# Patient Record
Sex: Male | Born: 1960 | Race: White | Hispanic: No | Marital: Married | Smoking: Never smoker
Health system: Southern US, Community
[De-identification: ages and names within clinical notes are randomized; demographics above are authoritative.]

## PROBLEM LIST (undated history)

## (undated) DIAGNOSIS — I10 Essential (primary) hypertension: Secondary | ICD-10-CM

## (undated) DIAGNOSIS — E785 Hyperlipidemia, unspecified: Secondary | ICD-10-CM

## (undated) DIAGNOSIS — F32A Depression, unspecified: Secondary | ICD-10-CM

## (undated) DIAGNOSIS — F329 Major depressive disorder, single episode, unspecified: Secondary | ICD-10-CM

## (undated) DIAGNOSIS — F419 Anxiety disorder, unspecified: Secondary | ICD-10-CM

## (undated) DIAGNOSIS — J302 Other seasonal allergic rhinitis: Secondary | ICD-10-CM

## (undated) DIAGNOSIS — G20 Parkinson's disease: Secondary | ICD-10-CM

## (undated) HISTORY — PX: NECK SURGERY: SHX720

## (undated) HISTORY — PX: KNEE SURGERY: SHX244

## (undated) HISTORY — PX: ADENOIDECTOMY: SUR15

## (undated) HISTORY — PX: TONSILLECTOMY: SUR1361

---

## 2011-04-06 DIAGNOSIS — Z8249 Family history of ischemic heart disease and other diseases of the circulatory system: Secondary | ICD-10-CM | POA: Insufficient documentation

## 2011-04-06 DIAGNOSIS — Z720 Tobacco use: Secondary | ICD-10-CM | POA: Insufficient documentation

## 2011-06-11 DIAGNOSIS — M5412 Radiculopathy, cervical region: Secondary | ICD-10-CM | POA: Insufficient documentation

## 2011-06-11 DIAGNOSIS — F09 Unspecified mental disorder due to known physiological condition: Secondary | ICD-10-CM | POA: Insufficient documentation

## 2011-06-11 DIAGNOSIS — M5416 Radiculopathy, lumbar region: Secondary | ICD-10-CM | POA: Insufficient documentation

## 2011-10-08 ENCOUNTER — Encounter: Payer: Self-pay | Admitting: *Deleted

## 2011-10-08 ENCOUNTER — Emergency Department
Admission: EM | Admit: 2011-10-08 | Discharge: 2011-10-08 | Disposition: A | Discharge status: Home / Self Care | Payer: No Typology Code available for payment source | Source: Home / Self Care | Attending: Family Medicine | Admitting: Family Medicine

## 2011-10-08 DIAGNOSIS — J069 Acute upper respiratory infection, unspecified: Secondary | ICD-10-CM

## 2011-10-08 DIAGNOSIS — J0100 Acute maxillary sinusitis, unspecified: Secondary | ICD-10-CM

## 2011-10-08 HISTORY — DX: Essential (primary) hypertension: I10

## 2011-10-08 HISTORY — DX: Anxiety disorder, unspecified: F41.9

## 2011-10-08 HISTORY — DX: Hyperlipidemia, unspecified: E78.5

## 2011-10-08 MED ORDER — BENZONATATE 200 MG PO CAPS: 200.0000 mg | ORAL_CAPSULE | Freq: Every day | ORAL | Status: AC

## 2011-10-08 MED ORDER — MECLIZINE HCL 25 MG PO TABS: ORAL_TABLET | ORAL | Status: AC

## 2011-10-08 MED ORDER — AMOXICILLIN 875 MG PO TABS: 875.0000 mg | ORAL_TABLET | Freq: Two times a day (BID) | ORAL | Status: AC

## 2011-10-08 NOTE — ED Notes (Signed)
Pt c/o nasal congestion, bilateral ear ache, vertigo, and sinus pressure x 4 days. Denies fever. He has taken coricidin HBP with no relief.

## 2011-10-08 NOTE — ED Provider Notes (Signed)
History     CSN: [id number]  Arrival date & time 10/08/11  1035   First MD Initiated Contact with Patient 10/08/11 1108      Chief Complaint  Patient presents with  . Nasal Congestion  . Otalgia      HPI Comments: Patient complains of approximately 5 day history of gradually progressive URI symptoms beginning with a mild sore throat (now improved), followed by progressive nasal congestion.  A cough started about 2 days ago.  Complains of fatigue but no myalgias.  Cough is now worse at night and partly productive during the day.  There has been no pleuritic pain, shortness of breath, or wheezes.  He has now developed bilateral facial pressure, and today he had mild bilateral earache.  He has also intermittently felt dizzy with movement today.  The history is provided by the patient.    Past Medical History  Diagnosis Date  . Hypertension   . Anxiety   . Hyperlipemia     Past Surgical History  Procedure Date  . Knee surgery   . Tonsillectomy   . Adenoidectomy     Family History  Problem Relation Age of Onset  . Hypertension Mother   . Anxiety disorder Mother   . Hypertension Father   . Anxiety disorder Father   . Hypertension Brother   . Anxiety disorder Brother     History  Substance Use Topics  . Smoking status: Never Smoker   . Smokeless tobacco: Not on file  . Alcohol Use: No      Review of Systems + sore throat + cough + dizziness No pleuritic pain No wheezing + nasal congestion + post-nasal drainage + sinus pain/pressure No itchy/red eyes ? earache No hemoptysis No SOB No fever/chills No nausea No vomiting No abdominal pain No diarrhea No urinary symptoms No skin rashes + fatigue No myalgias + headache Used OTC meds without relief  Allergies  Codeine  Home Medications   Current Outpatient Rx  Name Route Sig Dispense Refill  . ALPRAZOLAM 0.25 MG PO TABS Oral Take 0.25 mg by mouth at bedtime as needed.    Marland Kitchen AMLODIPINE BESYLATE 5  MG PO TABS Oral Take 5 mg by mouth daily.    Marland Kitchen ESCITALOPRAM OXALATE 10 MG PO TABS Oral Take 10 mg by mouth daily.    Marland Kitchen OLMESARTAN MEDOXOMIL 20 MG PO TABS Oral Take 20 mg by mouth daily.    Marland Kitchen SIMVASTATIN 10 MG PO TABS Oral Take 10 mg by mouth at bedtime.    . AMOXICILLIN 875 MG PO TABS Oral Take 1 tablet (875 mg total) by mouth 2 (two) times daily. 20 tablet 0  . BENZONATATE 200 MG PO CAPS Oral Take 1 capsule (200 mg total) by mouth at bedtime. Take as needed for cough 12 capsule 0  . MECLIZINE HCL 25 MG PO TABS  Take one tab by mouth 2 or 3 times daily as needed for dizziness 15 tablet 0    BP 136/87  Pulse 54  Temp 98 F (36.7 C) (Oral)  Resp 18  Ht 5\' 9"  (1.753 m)  Wt 193 lb 8 oz (87.771 kg)  BMI 28.57 kg/m2  SpO2 98%  Physical Exam Nursing notes and Vital Signs reviewed. Appearance:  Patient appears healthy, stated age, and in no acute distress Eyes:  Pupils are equal, round, and reactive to light and accomodation.  Extraocular movement is intact.  Conjunctivae are not inflamed.  No nystagmus  Ears:  Canals normal.  Tympanic membranes normal.  Nose:  Mildly congested turbinates.  Maxillary sinus tenderness is present.  Pharynx:  Normal Neck:  Supple.  Slightly tender shotty anterior/posterior nodes are palpated bilaterally  Lungs:  Clear to auscultation.  Breath sounds are equal.  Heart:  Regular rate and rhythm without murmurs, rubs, or gallops.  Abdomen:  Nontender without masses or hepatosplenomegaly.  Bowel sounds are present.  No CVA or flank tenderness.  Extremities:  No edema.  No calf tenderness Skin:  No rash present.   ED Course  Procedures  none      1. Acute upper respiratory infections of unspecified site   2. Acute maxillary sinusitis       MDM  Begin amoxicillin.  Prescription written for Benzonatate [facility]) to take at bedtime for night-time cough.  Take plain Mucinex (guaifenesin) twice daily for cough and congestion.  Increase fluid intake,  rest. May use Afrin nasal spray (or generic oxymetazoline) twice daily for about 5 days.  Also recommend using saline nasal spray several times daily and saline nasal irrigation (AYR is a common brand) Stop all antihistamines for now, and other non-prescription cough/cold preparations. Since his dizziness in intermittent, will write Rx for Meclizine as an option.  May begin Meclizine if dizziness becomes worse. Follow-up with family doctor if not improving 7 to 10 days.         Lattie Haw, MD 10/08/11 1128

## 2011-10-10 ENCOUNTER — Telehealth: Payer: Self-pay

## 2011-10-10 NOTE — ED Notes (Signed)
Left a message on voice mail asking how patient is feeling and advising to call back with any questions or concerns.  

## 2011-10-28 ENCOUNTER — Emergency Department
Admission: EM | Admit: 2011-10-28 | Discharge: 2011-10-28 | Disposition: A | Discharge status: Home / Self Care | Payer: No Typology Code available for payment source | Source: Home / Self Care

## 2011-10-28 ENCOUNTER — Encounter: Payer: Self-pay | Admitting: *Deleted

## 2011-10-28 DIAGNOSIS — J069 Acute upper respiratory infection, unspecified: Secondary | ICD-10-CM

## 2011-10-28 DIAGNOSIS — J309 Allergic rhinitis, unspecified: Secondary | ICD-10-CM

## 2011-10-28 MED ORDER — CETIRIZINE HCL 10 MG PO TABS: 10.0000 mg | ORAL_TABLET | Freq: Every day | ORAL | Status: DC

## 2011-10-28 MED ORDER — AZITHROMYCIN 250 MG PO TABS: ORAL_TABLET | ORAL | Status: DC

## 2011-10-28 MED ORDER — FLUTICASONE PROPIONATE 50 MCG/ACT NA SUSP: NASAL | Status: DC

## 2011-10-28 NOTE — ED Provider Notes (Signed)
History     CSN: [id number]  Arrival date & time 10/28/11  4098   First MD Initiated Contact with Patient 10/28/11 1013      Chief Complaint  Patient presents with  . Nasal Congestion  . Fatigue   HPI URI Symptoms Onset: 4-5 days  Description: rhinorrhea, nasal congestion, post nasal drip, general fatigue Modifying factors:  Pt recently seen here for similar sxs 3 weeks ago. Was placed on course of amox. Pt states that sxs improved while on medication and resolved until last weekend when sxs returned. Sxs were milder. No joint pains, headache.   Symptoms Nasal discharge: yes Fever: no Sore throat: no Cough: no Wheezing: no Ear pain: no GI symptoms: no Sick contacts: no  Red Flags  Stiff neck: no Dyspnea: no Rash: no Swallowing difficulty: no  Sinusitis Risk Factors Headache/face pain: no Double sickening: no tooth pain: no  Allergy Risk Factors Sneezing: yes Itchy scratchy throat: yes Seasonal symptoms: yes  Flu Risk Factors Headache: no muscle aches: no severe fatigue: no   Past Medical History  Diagnosis Date  . Hypertension   . Anxiety   . Hyperlipemia     Past Surgical History  Procedure Date  . Knee surgery   . Tonsillectomy   . Adenoidectomy     Family History  Problem Relation Age of Onset  . Hypertension Mother   . Anxiety disorder Mother   . Hypertension Father   . Anxiety disorder Father   . Hypertension Brother   . Anxiety disorder Brother     History  Substance Use Topics  . Smoking status: Never Smoker   . Smokeless tobacco: Not on file  . Alcohol Use: No      Review of Systems  All other systems reviewed and are negative.    Allergies  Codeine and Other  Home Medications   Current Outpatient Rx  Name Route Sig Dispense Refill  . ALPRAZOLAM 0.25 MG PO TABS Oral Take 0.25 mg by mouth at bedtime as needed.    Marland Kitchen AMLODIPINE BESYLATE 5 MG PO TABS Oral Take 5 mg by mouth daily.    Marland Kitchen ESCITALOPRAM OXALATE 10 MG PO  TABS Oral Take 10 mg by mouth daily.    Marland Kitchen OLMESARTAN MEDOXOMIL 20 MG PO TABS Oral Take 20 mg by mouth daily.    Marland Kitchen SIMVASTATIN 10 MG PO TABS Oral Take 10 mg by mouth at bedtime.      BP 138/88  Pulse 58  Temp 98.4 F (36.9 C) (Oral)  Resp 14  Ht 5\' 9"  (1.753 m)  Wt 193 lb (87.544 kg)  BMI 28.50 kg/m2  SpO2 99%  Physical Exam  Constitutional: He appears well-developed and well-nourished.  HENT:  Head: Normocephalic and atraumatic.  Right Ear: External ear normal.  Left Ear: External ear normal.       +nasal erythema, rhinorrhea bilaterally, + post oropharyngeal erythema    Eyes: Conjunctivae are normal. Pupils are equal, round, and reactive to light.  Neck: Normal range of motion. Neck supple.  Cardiovascular: Normal rate and regular rhythm.   Pulmonary/Chest: Effort normal and breath sounds normal.  Abdominal: Soft. Bowel sounds are normal.  Musculoskeletal: Normal range of motion.  Lymphadenopathy:    He has no cervical adenopathy.  Neurological: He is alert.  Skin: Skin is warm.    ED Course  Procedures (including critical care time)  Labs Reviewed - No data to display No results found.   1. URI (upper respiratory infection)  2. Allergic rhinitis       MDM  Suspect overlapping viral and allergic (predominant) etiology of sxs.  Will place on course of zyrtec and flonase.  Ppx Rx of azithromycin if sxs fail to improve in 3-5 days.  Discussed overall treatment course with pt as well as infectious red flags.  Would consider ENT referral vs. Sinus CT if sxs persist despite treatment.       The patient and/or caregiver has been counseled thoroughly with regard to treatment plan and/or medications prescribed including dosage, schedule, interactions, rationale for use, and possible side effects and they verbalize understanding. Diagnoses and expected course of recovery discussed and will return if not improved as expected or if the condition worsens. Patient  and/or caregiver verbalized understanding.             Doree Albee, MD 10/28/11 1057

## 2011-10-28 NOTE — ED Notes (Signed)
Patient was treated 3 weeks ago for same symptoms, sore throat, congestion, and fatigue. Finished amoxicillin. Patient improved then worsened again.

## 2012-03-24 DIAGNOSIS — Z8371 Family history of colonic polyps: Secondary | ICD-10-CM | POA: Insufficient documentation

## 2012-03-24 DIAGNOSIS — D72819 Decreased white blood cell count, unspecified: Secondary | ICD-10-CM | POA: Insufficient documentation

## 2013-04-05 DIAGNOSIS — F1120 Opioid dependence, uncomplicated: Secondary | ICD-10-CM | POA: Insufficient documentation

## 2013-07-25 ENCOUNTER — Other Ambulatory Visit: Payer: Self-pay | Admitting: Neurological Surgery

## 2013-07-25 DIAGNOSIS — M47812 Spondylosis without myelopathy or radiculopathy, cervical region: Secondary | ICD-10-CM

## 2013-09-04 ENCOUNTER — Ambulatory Visit (INDEPENDENT_AMBULATORY_CARE_PROVIDER_SITE_OTHER): Payer: BC Managed Care – PPO

## 2013-09-04 ENCOUNTER — Other Ambulatory Visit: Payer: Self-pay | Admitting: Neurological Surgery

## 2013-09-04 DIAGNOSIS — Z9889 Other specified postprocedural states: Secondary | ICD-10-CM

## 2013-10-08 DIAGNOSIS — I10 Essential (primary) hypertension: Secondary | ICD-10-CM | POA: Insufficient documentation

## 2013-11-21 ENCOUNTER — Emergency Department (INDEPENDENT_AMBULATORY_CARE_PROVIDER_SITE_OTHER)
Admission: EM | Admit: 2013-11-21 | Discharge: 2013-11-21 | Disposition: A | Discharge status: Home / Self Care | Payer: BC Managed Care – PPO | Source: Home / Self Care | Attending: Family Medicine | Admitting: Family Medicine

## 2013-11-21 ENCOUNTER — Encounter: Payer: Self-pay | Admitting: Emergency Medicine

## 2013-11-21 DIAGNOSIS — B9789 Other viral agents as the cause of diseases classified elsewhere: Secondary | ICD-10-CM

## 2013-11-21 DIAGNOSIS — J329 Chronic sinusitis, unspecified: Secondary | ICD-10-CM

## 2013-11-21 MED ORDER — IPRATROPIUM BROMIDE 0.06 % NA SOLN: 2.0000 | Freq: Four times a day (QID) | NASAL | Status: DC

## 2013-11-21 MED ORDER — PREDNISONE 10 MG PO TABS: 30.0000 mg | ORAL_TABLET | Freq: Every day | ORAL | Status: DC

## 2013-11-21 MED ORDER — AMOXICILLIN-POT CLAVULANATE 875-125 MG PO TABS: 1.0000 | ORAL_TABLET | Freq: Two times a day (BID) | ORAL | Status: DC

## 2013-11-21 NOTE — ED Notes (Signed)
Emmett c/o 4 days of sinus pain, congestion, ear fulness and HA without fever. Taken zyrtec and IBF otc.

## 2013-11-21 NOTE — ED Provider Notes (Signed)
Daniel Ortega is a 53 y.o. male who presents to Urgent Care today for facial pain and pressure ear fullness nasal congestion and runny nose. Symptoms present for 4 days. She notes some blood-tinged nasal discharge as well. No fevers or chills shortness of breath or wheezing. No chest pain or palpitations. Patient has tried Zyrtec and ibuprofen which helped only a little. He feels well otherwise.   Past Medical History  Diagnosis Date  . Hypertension   . Anxiety   . Hyperlipemia    History  Substance Use Topics  . Smoking status: Never Smoker   . Smokeless tobacco: Not on file  . Alcohol Use: No   ROS as above Medications: No current facility-administered medications for this encounter.   Current Outpatient Prescriptions  Medication Sig Dispense Refill  . ALPRAZolam (XANAX) 0.25 MG tablet Take 0.25 mg by mouth at bedtime as needed.      . Celecoxib (CELEBREX PO) Take by mouth.      . escitalopram (LEXAPRO) 10 MG tablet Take 10 mg by mouth daily.      . simvastatin (ZOCOR) 10 MG tablet Take 10 mg by mouth at bedtime.      Marland Kitchen TIZANIDINE HCL PO Take by mouth.      Marland Kitchen UNKNOWN TO PATIENT Anti-hypertensive      . amoxicillin-clavulanate (AUGMENTIN) 875-125 MG per tablet Take 1 tablet by mouth every 12 (twelve) hours.  14 tablet  0  . cetirizine (ZYRTEC) 10 MG tablet Take 1 tablet (10 mg total) by mouth daily.  30 tablet  6  . ipratropium (ATROVENT) 0.06 % nasal spray Place 2 sprays into both nostrils 4 (four) times daily.  15 mL  1  . olmesartan (BENICAR) 20 MG tablet Take 20 mg by mouth daily.      . predniSONE (DELTASONE) 10 MG tablet Take 3 tablets (30 mg total) by mouth daily.  15 tablet  0    Exam:  BP 144/92  Pulse 60  Temp(Src) 97.9 F (36.6 C) (Oral)  Resp 14  Wt 178 lb (80.74 kg)  SpO2 97% Gen: Well NAD HEENT: EOMI,  MMM posterior pharynx with cobblestoning. Tympanic membranes are normal appearing bilaterally. Mildly inflamed nasal turbinates bilaterally with clear  discharge. Nontender maxillary and frontal sinuses bilaterally. Lungs: Normal work of breathing. CTABL Heart: RRR no MRG Abd: NABS, Soft. Nondistended, Nontender Exts: Brisk capillary refill, warm and well perfused.   No results found for this or any previous visit (from the past 24 hour(s)). No results found.  Assessment and Plan: 53 y.o. male with viral sinusitis. Plan to treat with prednisone and Atrovent nasal spray. Augmentin for use if not improving or worsening.  Discussed warning signs or symptoms. Please see discharge instructions. Patient expresses understanding.     Gregor Hams, MD 11/21/13 [phone]

## 2013-11-21 NOTE — Discharge Instructions (Signed)
Thank you for coming in today. Take prednisone daily and continue to use Atrovent nasal spray. Use Tylenol as needed. If not getting better start taking Augmentin antibiotics Call or go to the emergency room if you get worse, have trouble breathing, have chest pains, or palpitations.   Sinusitis Sinusitis is redness, soreness, and inflammation of the paranasal sinuses. Paranasal sinuses are air pockets within the bones of your face (beneath the eyes, the middle of the forehead, or above the eyes). In healthy paranasal sinuses, mucus is able to drain out, and air is able to circulate through them by way of your nose. However, when your paranasal sinuses are inflamed, mucus and air can become trapped. This can allow bacteria and other germs to grow and cause infection. Sinusitis can develop quickly and last only a short time (acute) or continue over a long period (chronic). Sinusitis that lasts for more than 12 weeks is considered chronic.  CAUSES  Causes of sinusitis include:  Allergies.  Structural abnormalities, such as displacement of the cartilage that separates your nostrils (deviated septum), which can decrease the air flow through your nose and sinuses and affect sinus drainage.  Functional abnormalities, such as when the small hairs (cilia) that line your sinuses and help remove mucus do not work properly or are not present. SIGNS AND SYMPTOMS  Symptoms of acute and chronic sinusitis are the same. The primary symptoms are pain and pressure around the affected sinuses. Other symptoms include:  Upper toothache.  Earache.  Headache.  Bad breath.  Decreased sense of smell and taste.  A cough, which worsens when you are lying flat.  Fatigue.  Fever.  Thick drainage from your nose, which often is green and may contain pus (purulent).  Swelling and warmth over the affected sinuses. DIAGNOSIS  Your health care provider will perform a physical exam. During the exam, your health  care provider may:  Look in your nose for signs of abnormal growths in your nostrils (nasal polyps).  Tap over the affected sinus to check for signs of infection.  View the inside of your sinuses (endoscopy) using an imaging device that has a light attached (endoscope). If your health care provider suspects that you have chronic sinusitis, one or more of the following tests may be recommended:  Allergy tests.  Nasal culture. A sample of mucus is taken from your nose, sent to a lab, and screened for bacteria.  Nasal cytology. A sample of mucus is taken from your nose and examined by your health care provider to determine if your sinusitis is related to an allergy. TREATMENT  Most cases of acute sinusitis are related to a viral infection and will resolve on their own within 10 days. Sometimes medicines are prescribed to help relieve symptoms (pain medicine, decongestants, nasal steroid sprays, or saline sprays).  However, for sinusitis related to a bacterial infection, your health care provider will prescribe antibiotic medicines. These are medicines that will help kill the bacteria causing the infection.  Rarely, sinusitis is caused by a fungal infection. In theses cases, your health care provider will prescribe antifungal medicine. For some cases of chronic sinusitis, surgery is needed. Generally, these are cases in which sinusitis recurs more than 3 times per year, despite other treatments. HOME CARE INSTRUCTIONS   Drink plenty of water. Water helps thin the mucus so your sinuses can drain more easily.  Use a humidifier.  Inhale steam 3 to 4 times a day (for example, sit in the bathroom with the  shower running).  Apply a warm, moist washcloth to your face 3 to 4 times a day, or as directed by your health care provider.  Use saline nasal sprays to help moisten and clean your sinuses.  Take medicines only as directed by your health care provider.  If you were prescribed either an  antibiotic or antifungal medicine, finish it all even if you start to feel better. SEEK IMMEDIATE MEDICAL CARE IF:  You have increasing pain or severe headaches.  You have nausea, vomiting, or drowsiness.  You have swelling around your face.  You have vision problems.  You have a stiff neck.  You have difficulty breathing. MAKE SURE YOU:   Understand these instructions.  Will watch your condition.  Will get help right away if you are not doing well or get worse. Document Released: 02/08/2005 Document Revised: 06/25/2013 Document Reviewed: 02/23/2011 [facility] Patient Information 2015 [place], [place]. This information is not intended to replace advice given to you by your health care provider. Make sure you discuss any questions you have with your health care provider.

## 2014-02-08 ENCOUNTER — Other Ambulatory Visit: Payer: Self-pay | Admitting: Neurological Surgery

## 2014-02-08 DIAGNOSIS — M542 Cervicalgia: Secondary | ICD-10-CM

## 2014-02-13 ENCOUNTER — Ambulatory Visit (HOSPITAL_BASED_OUTPATIENT_CLINIC_OR_DEPARTMENT_OTHER)
Admission: RE | Admit: 2014-02-13 | Discharge: 2014-02-13 | Disposition: A | Discharge status: Home / Self Care | Payer: BC Managed Care – PPO | Source: Ambulatory Visit | Attending: Neurological Surgery | Admitting: Neurological Surgery

## 2014-02-13 DIAGNOSIS — M542 Cervicalgia: Secondary | ICD-10-CM | POA: Insufficient documentation

## 2014-04-15 DIAGNOSIS — R251 Tremor, unspecified: Secondary | ICD-10-CM | POA: Insufficient documentation

## 2014-05-21 ENCOUNTER — Other Ambulatory Visit: Payer: Self-pay | Admitting: Neurological Surgery

## 2014-05-21 DIAGNOSIS — M542 Cervicalgia: Secondary | ICD-10-CM

## 2014-05-30 ENCOUNTER — Other Ambulatory Visit: Payer: Self-pay

## 2014-05-30 DIAGNOSIS — R972 Elevated prostate specific antigen [PSA]: Secondary | ICD-10-CM | POA: Insufficient documentation

## 2014-05-30 DIAGNOSIS — I471 Supraventricular tachycardia: Secondary | ICD-10-CM | POA: Insufficient documentation

## 2014-05-30 DIAGNOSIS — E785 Hyperlipidemia, unspecified: Secondary | ICD-10-CM | POA: Insufficient documentation

## 2014-05-30 DIAGNOSIS — F419 Anxiety disorder, unspecified: Secondary | ICD-10-CM | POA: Insufficient documentation

## 2014-05-30 DIAGNOSIS — K219 Gastro-esophageal reflux disease without esophagitis: Secondary | ICD-10-CM | POA: Insufficient documentation

## 2014-05-30 DIAGNOSIS — R7301 Impaired fasting glucose: Secondary | ICD-10-CM | POA: Insufficient documentation

## 2014-05-30 DIAGNOSIS — J309 Allergic rhinitis, unspecified: Secondary | ICD-10-CM | POA: Insufficient documentation

## 2014-05-30 DIAGNOSIS — M1990 Unspecified osteoarthritis, unspecified site: Secondary | ICD-10-CM | POA: Insufficient documentation

## 2014-05-30 DIAGNOSIS — G44009 Cluster headache syndrome, unspecified, not intractable: Secondary | ICD-10-CM | POA: Insufficient documentation

## 2014-06-03 ENCOUNTER — Ambulatory Visit
Admission: RE | Admit: 2014-06-03 | Discharge: 2014-06-03 | Disposition: A | Discharge status: Home / Self Care | Payer: Self-pay | Source: Ambulatory Visit | Attending: Neurological Surgery | Admitting: Neurological Surgery

## 2014-06-03 DIAGNOSIS — M5412 Radiculopathy, cervical region: Secondary | ICD-10-CM

## 2014-06-03 DIAGNOSIS — M542 Cervicalgia: Secondary | ICD-10-CM

## 2014-06-03 DIAGNOSIS — I471 Supraventricular tachycardia: Secondary | ICD-10-CM | POA: Insufficient documentation

## 2014-06-03 MED ORDER — DIAZEPAM 5 MG PO TABS: 10.0000 mg | ORAL_TABLET | Freq: Once | ORAL | Status: DC

## 2014-06-03 MED ORDER — IOHEXOL 300 MG/ML  SOLN
10.0000 mL | Freq: Once | INTRAMUSCULAR | Status: AC | PRN
  Administered 2014-06-03: 10 mL via INTRATHECAL

## 2014-06-03 NOTE — Progress Notes (Signed)
Patient states he has been off Cymbalta for the past two days.  jkl

## 2014-06-03 NOTE — Discharge Instructions (Signed)
Myelogram Discharge Instructions  1. Go home and rest quietly for the next 24 hours.  It is important to lie flat for the next 24 hours.  Get up only to go to the restroom.  You may lie in the bed or on a couch on your back, your stomach, your left side or your right side.  You may have one pillow under your head.  You may have pillows between your knees while you are on your side or under your knees while you are on your back.  2. DO NOT drive today.  Recline the seat as far back as it will go, while still wearing your seat belt, on the way home.  3. You may get up to go to the bathroom as needed.  You may sit up for 10 minutes to eat.  You may resume your normal diet and medications unless otherwise indicated.  Drink plenty of extra fluids today and tomorrow.  4. The incidence of a spinal headache with nausea and/or vomiting is about 5% (one in 20 patients).  If you develop a headache, lie flat and drink plenty of fluids until the headache goes away.  Caffeinated beverages may be helpful.  If you develop severe nausea and vomiting or a headache that does not go away with flat bed rest, call [phone].  5. You may resume normal activities after your 24 hours of bed rest is over; however, do not exert yourself strongly or do any heavy lifting tomorrow.  6. Call your physician for a follow-up appointment.   You may resume Cymbalta on Tuesday, June 04, 2014 after 7:30a.m.

## 2014-09-03 ENCOUNTER — Ambulatory Visit (INDEPENDENT_AMBULATORY_CARE_PROVIDER_SITE_OTHER): Payer: BLUE CROSS/BLUE SHIELD

## 2014-09-03 ENCOUNTER — Other Ambulatory Visit: Payer: Self-pay | Admitting: Neurological Surgery

## 2014-09-03 DIAGNOSIS — M542 Cervicalgia: Secondary | ICD-10-CM | POA: Diagnosis not present

## 2014-09-03 DIAGNOSIS — M5020 Other cervical disc displacement, unspecified cervical region: Secondary | ICD-10-CM

## 2014-12-10 ENCOUNTER — Other Ambulatory Visit: Payer: Self-pay | Admitting: Neurological Surgery

## 2014-12-10 ENCOUNTER — Ambulatory Visit (INDEPENDENT_AMBULATORY_CARE_PROVIDER_SITE_OTHER): Payer: BLUE CROSS/BLUE SHIELD

## 2014-12-10 DIAGNOSIS — Z981 Arthrodesis status: Secondary | ICD-10-CM | POA: Diagnosis not present

## 2014-12-10 DIAGNOSIS — M542 Cervicalgia: Secondary | ICD-10-CM

## 2014-12-16 ENCOUNTER — Emergency Department
Admission: EM | Admit: 2014-12-16 | Discharge: 2014-12-16 | Disposition: A | Discharge status: Home / Self Care | Payer: BLUE CROSS/BLUE SHIELD | Source: Home / Self Care | Attending: Family Medicine | Admitting: Family Medicine

## 2014-12-16 ENCOUNTER — Encounter: Payer: Self-pay | Admitting: *Deleted

## 2014-12-16 DIAGNOSIS — B9789 Other viral agents as the cause of diseases classified elsewhere: Principal | ICD-10-CM

## 2014-12-16 DIAGNOSIS — J069 Acute upper respiratory infection, unspecified: Secondary | ICD-10-CM | POA: Diagnosis not present

## 2014-12-16 MED ORDER — PREDNISONE 20 MG PO TABS: 20.0000 mg | ORAL_TABLET | Freq: Two times a day (BID) | ORAL | Status: DC

## 2014-12-16 MED ORDER — BENZONATATE 200 MG PO CAPS: 200.0000 mg | ORAL_CAPSULE | Freq: Every day | ORAL | Status: DC

## 2014-12-16 MED ORDER — AMOXICILLIN-POT CLAVULANATE 875-125 MG PO TABS: 1.0000 | ORAL_TABLET | Freq: Two times a day (BID) | ORAL | Status: DC

## 2014-12-16 NOTE — Discharge Instructions (Signed)
Take plain guaifenesin (1200mg  extended release tabs such as Mucinex) twice daily, with plenty of water, for cough and congestion.  Get adequate rest.   May use Afrin nasal spray (or generic oxymetazoline) twice daily for about 5 days and then discontinue.  Also recommend using saline nasal spray several times daily and saline nasal irrigation (AYR is a common brand).  Use Flonase nasal spray each morning after using Afrin nasal spray and saline nasal irrigation. Try warm salt water gargles for sore throat.  Stop all antihistamines for now, and other non-prescription cough/cold preparations. Begin Augmentin if not improving about one week or if persistent fever develops   Follow-up with family doctor if not improving about10 days.

## 2014-12-16 NOTE — ED Notes (Signed)
Pt c/o nasal congestion, sinus pressure, nose bleeds and bilateral ear pressure x 6 days. Denies fever. Took Advil and Mucinex.

## 2014-12-16 NOTE — ED Provider Notes (Signed)
CSN: [id number]     Arrival date & time 12/16/14  1423 History   First MD Initiated Contact with Patient 12/16/14 1432     Chief Complaint  Patient presents with  . Nasal Congestion      HPI Comments: Patient complains of six day history of typical cold-like symptoms including mild sore throat (now resolved), sinus congestion, headache, fatigue, and cough.  He has had sweats but no fever, and his ears feel clogged.  The history is provided by the patient.    Past Medical History  Diagnosis Date  . Hypertension   . Anxiety   . Hyperlipemia    Past Surgical History  Procedure Laterality Date  . Knee surgery    . Tonsillectomy    . Adenoidectomy    . Neck surgery     Family History  Problem Relation Age of Onset  . Hypertension Mother   . Anxiety disorder Mother   . Hypertension Father   . Anxiety disorder Father   . Hypertension Brother   . Anxiety disorder Brother    Social History  Substance Use Topics  . Smoking status: Never Smoker   . Smokeless tobacco: None  . Alcohol Use: No    Review of Systems + sore throat + cough No pleuritic pain No wheezing + nasal congestion + post-nasal drainage ? sinus pain/pressure No itchy/red eyes No earache No hemoptysis No SOB No fever, + chills/sweats No nausea No vomiting No abdominal pain No diarrhea No urinary symptoms No skin rash + fatigue No myalgias + headache Used OTC meds without relief  Allergies  Bee venom; Atorvastatin; Codeine; and Metoprolol tartrate  Home Medications   Prior to Admission medications   Medication Sig Start Date End Date Taking? Authorizing Provider  ALPRAZolam Duanne Moron) 0.5 MG tablet TAKE ONE TABLET BY MOUTH 3 TIMES A DAY 05/09/14  Yes Historical Provider, MD  aspirin 81 MG chewable tablet Chew 81 mg by mouth.   Yes Historical Provider, MD  DULoxetine (CYMBALTA) 60 MG capsule Take 60 mg by mouth.   Yes Historical Provider, MD  simvastatin (ZOCOR) 40 MG tablet Take 40 mg by  mouth. 05/09/14  Yes Historical Provider, MD  TIZANIDINE HCL PO Take by mouth.   Yes Historical Provider, MD  valsartan-hydrochlorothiazide (DIOVAN-HCT) 320-25 MG per tablet Take 1 tablet by mouth. 05/27/14 05/27/15 Yes Historical Provider, MD  amoxicillin-clavulanate (AUGMENTIN) 875-125 MG tablet Take 1 tablet by mouth 2 (two) times daily. Take with food (Rx void after 12/24/14) 12/16/14   Kandra Nicolas, MD  benzonatate (TESSALON) 200 MG capsule Take 1 capsule (200 mg total) by mouth at bedtime. Take as needed for cough 12/16/14   Kandra Nicolas, MD  diclofenac (VOLTAREN) 75 MG EC tablet Take 75 mg by mouth. 01/24/12   Historical Provider, MD  escitalopram (LEXAPRO) 10 MG tablet Take 10 mg by mouth daily.    Historical Provider, MD  olmesartan (BENICAR) 20 MG tablet Take 20 mg by mouth daily.    Historical Provider, MD  UNKNOWN TO PATIENT Anti-hypertensive    Historical Provider, MD   Meds Ordered and Administered this Visit  Medications - No data to display  BP 131/81 mmHg  Pulse 73  Temp(Src) 98.4 F (36.9 C) (Oral)  Resp 16  Ht 5\' 9"  (1.753 m)  Wt 191 lb (86.637 kg)  BMI 28.19 kg/m2  SpO2 98% No data found.   Physical Exam Nursing notes and Vital Signs reviewed. Appearance:  Patient appears stated age, and in  no acute distress Eyes:  Pupils are equal, round, and reactive to light and accomodation.  Extraocular movement is intact.  Conjunctivae are not inflamed  Ears:  Canals normal.  Tympanic membranes normal.  Nose:  Congested turbinates.  No sinus tenderness.     Pharynx:  Normal Neck:  Supple.  Tender enlarged posterior nodes are palpated bilaterally  Lungs:  Clear to auscultation.  Breath sounds are equal.  Moving air well. Heart:  Regular rate and rhythm without murmurs, rubs, or gallops.  Abdomen:  Nontender without masses or hepatosplenomegaly.  Bowel sounds are present.  No CVA or flank tenderness.  Extremities:  No edema.   Skin:  No rash present.   ED Course   Procedures  None    MDM   1. Viral URI with cough     There is no evidence of bacterial infection today.  Treat symptomatically for now  Rx for prednisone burst.  Prescription written for Benzonatate (Tessalon) to take at bedtime for night-time cough.  Take plain guaifenesin (1200mg  extended release tabs such as Mucinex) twice daily, with plenty of water, for cough and congestion.  Get adequate rest.   May use Afrin nasal spray (or generic oxymetazoline) twice daily for about 5 days and then discontinue.  Also recommend using saline nasal spray several times daily and saline nasal irrigation (AYR is a common brand).  Use Flonase nasal spray each morning after using Afrin nasal spray and saline nasal irrigation. Try warm salt water gargles for sore throat.  Stop all antihistamines for now, and other non-prescription cough/cold preparations. Begin Augmentin if not improving about one week or if persistent fever develops (Given Rx to hold)   Follow-up with family doctor if not improving about10 days.     Kandra Nicolas, MD 12/22/14 [phone]

## 2015-04-29 ENCOUNTER — Other Ambulatory Visit: Payer: Self-pay | Admitting: Neurological Surgery

## 2015-04-29 ENCOUNTER — Ambulatory Visit (INDEPENDENT_AMBULATORY_CARE_PROVIDER_SITE_OTHER): Payer: BLUE CROSS/BLUE SHIELD

## 2015-04-29 DIAGNOSIS — M542 Cervicalgia: Secondary | ICD-10-CM

## 2017-06-01 IMAGING — CR DG CERVICAL SPINE FLEX&EXT ONLY
3 series · 3 of 3 positions shown · non-contrast
Comparison: None.

CLINICAL DATA: Chronic posterior cervical spine pain and tingling
down both arms. History of cervical fusion from C4-C7.

EXAM:
CERVICAL SPINE - FLEXION AND EXTENSION VIEWS ONLY

[c-spine flex]
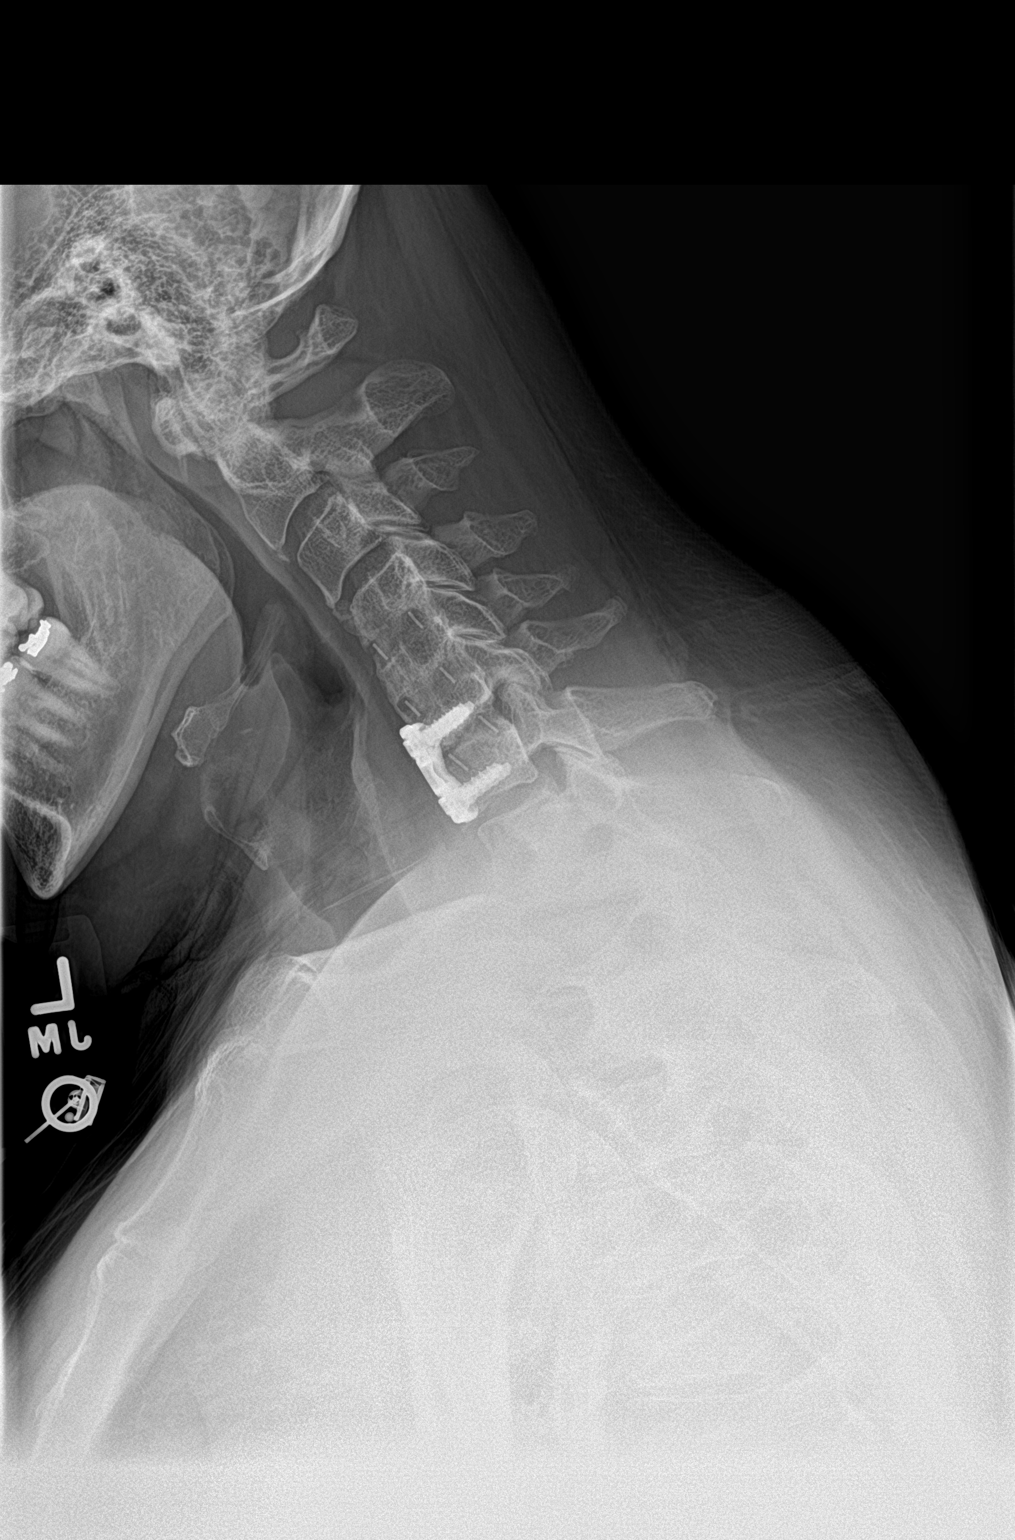

[c-spine ext]
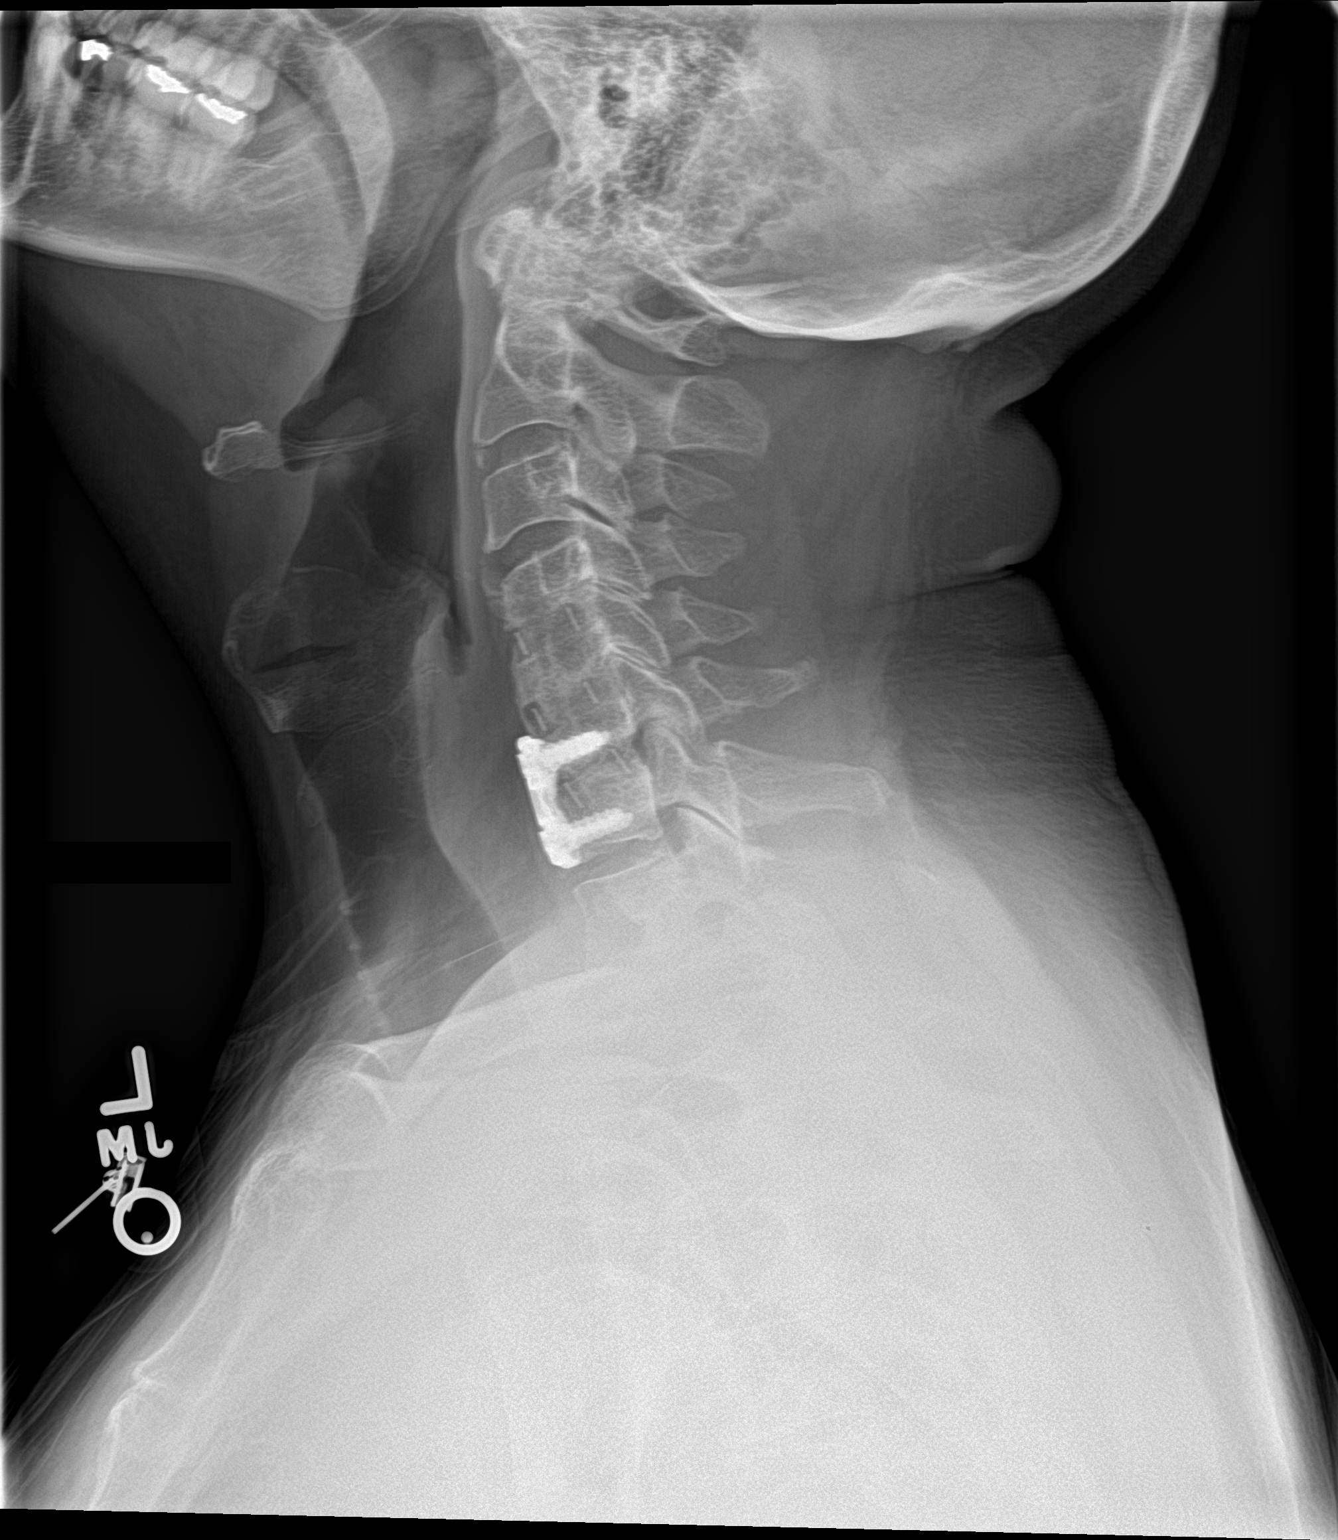

[c-spine lat]
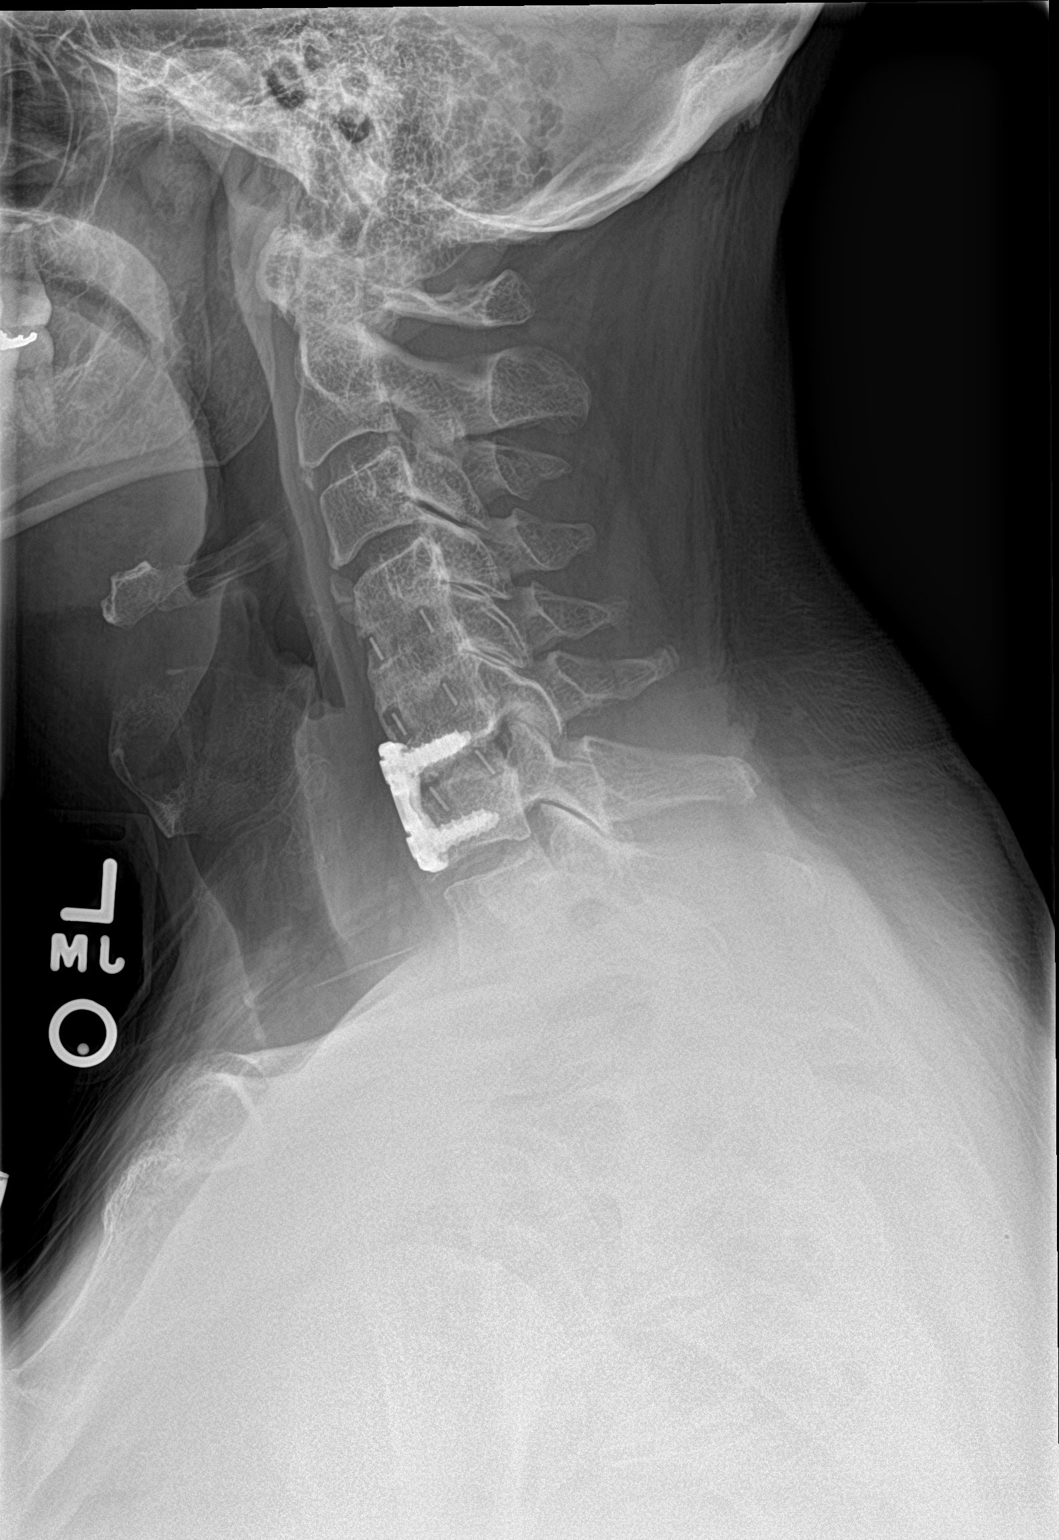

[3 of 3 positions shown; findings below may reference images not displayed]

FINDINGS: Neutral, flexion and extension lateral views of the cervical spine
are obtained, but limited flexion/extension views.

Interbody fusion from C4-C7 noted with anterior plate and screw
fixation is C6-7 -unchanged.

There is no evidence of acute fracture, subluxation, instability
from flexion -extension or prevertebral soft tissue swelling.

Mild flattening of the C5 and C6 vertebral bodies are again noted.
IMPRESSION: No significant interval change with C4-C7 fusion changes. No
evidence of instability on flexion -extension.

## 2017-11-23 DIAGNOSIS — M5417 Radiculopathy, lumbosacral region: Secondary | ICD-10-CM | POA: Diagnosis not present

## 2017-11-23 DIAGNOSIS — G5603 Carpal tunnel syndrome, bilateral upper limbs: Secondary | ICD-10-CM | POA: Diagnosis not present

## 2017-11-23 DIAGNOSIS — R69 Illness, unspecified: Secondary | ICD-10-CM | POA: Diagnosis not present

## 2017-11-23 DIAGNOSIS — G603 Idiopathic progressive neuropathy: Secondary | ICD-10-CM | POA: Diagnosis not present

## 2017-11-23 DIAGNOSIS — M5412 Radiculopathy, cervical region: Secondary | ICD-10-CM | POA: Diagnosis not present

## 2017-11-23 DIAGNOSIS — R202 Paresthesia of skin: Secondary | ICD-10-CM | POA: Diagnosis not present

## 2017-11-23 DIAGNOSIS — R270 Ataxia, unspecified: Secondary | ICD-10-CM | POA: Diagnosis not present

## 2017-11-23 DIAGNOSIS — Z79899 Other long term (current) drug therapy: Secondary | ICD-10-CM | POA: Diagnosis not present

## 2017-11-23 DIAGNOSIS — G20 Parkinson's disease: Secondary | ICD-10-CM | POA: Diagnosis not present

## 2017-12-13 DIAGNOSIS — R7301 Impaired fasting glucose: Secondary | ICD-10-CM | POA: Diagnosis not present

## 2017-12-13 DIAGNOSIS — Z0000 Encounter for general adult medical examination without abnormal findings: Secondary | ICD-10-CM | POA: Diagnosis not present

## 2017-12-15 DIAGNOSIS — M542 Cervicalgia: Secondary | ICD-10-CM | POA: Diagnosis not present

## 2017-12-15 DIAGNOSIS — G603 Idiopathic progressive neuropathy: Secondary | ICD-10-CM | POA: Diagnosis not present

## 2017-12-15 DIAGNOSIS — G20 Parkinson's disease: Secondary | ICD-10-CM | POA: Diagnosis not present

## 2017-12-15 DIAGNOSIS — R270 Ataxia, unspecified: Secondary | ICD-10-CM | POA: Diagnosis not present

## 2017-12-15 DIAGNOSIS — R69 Illness, unspecified: Secondary | ICD-10-CM | POA: Diagnosis not present

## 2017-12-15 DIAGNOSIS — M545 Low back pain: Secondary | ICD-10-CM | POA: Diagnosis not present

## 2017-12-20 DIAGNOSIS — R7301 Impaired fasting glucose: Secondary | ICD-10-CM | POA: Diagnosis not present

## 2017-12-20 DIAGNOSIS — Z0000 Encounter for general adult medical examination without abnormal findings: Secondary | ICD-10-CM | POA: Diagnosis not present

## 2017-12-20 DIAGNOSIS — I10 Essential (primary) hypertension: Secondary | ICD-10-CM | POA: Diagnosis not present

## 2017-12-20 DIAGNOSIS — Z8371 Family history of colonic polyps: Secondary | ICD-10-CM | POA: Diagnosis not present

## 2017-12-20 DIAGNOSIS — Z23 Encounter for immunization: Secondary | ICD-10-CM | POA: Diagnosis not present

## 2017-12-20 DIAGNOSIS — G20 Parkinson's disease: Secondary | ICD-10-CM | POA: Diagnosis not present

## 2017-12-20 DIAGNOSIS — E785 Hyperlipidemia, unspecified: Secondary | ICD-10-CM | POA: Diagnosis not present

## 2017-12-20 DIAGNOSIS — I471 Supraventricular tachycardia: Secondary | ICD-10-CM | POA: Diagnosis not present

## 2018-01-14 ENCOUNTER — Encounter: Payer: Self-pay | Admitting: Emergency Medicine

## 2018-01-14 ENCOUNTER — Other Ambulatory Visit: Payer: Self-pay

## 2018-01-14 ENCOUNTER — Emergency Department
Admission: EM | Admit: 2018-01-14 | Discharge: 2018-01-14 | Disposition: A | Discharge status: Home / Self Care | Payer: Medicare HMO | Source: Home / Self Care | Attending: Family Medicine | Admitting: Family Medicine

## 2018-01-14 DIAGNOSIS — J069 Acute upper respiratory infection, unspecified: Secondary | ICD-10-CM | POA: Diagnosis not present

## 2018-01-14 DIAGNOSIS — B9789 Other viral agents as the cause of diseases classified elsewhere: Secondary | ICD-10-CM | POA: Diagnosis not present

## 2018-01-14 HISTORY — DX: Major depressive disorder, single episode, unspecified: F32.9

## 2018-01-14 HISTORY — DX: Anxiety disorder, unspecified: F41.9

## 2018-01-14 HISTORY — DX: Parkinson's disease: G20

## 2018-01-14 HISTORY — DX: Other seasonal allergic rhinitis: J30.2

## 2018-01-14 HISTORY — DX: Depression, unspecified: F32.A

## 2018-01-14 MED ORDER — AMOXICILLIN-POT CLAVULANATE 875-125 MG PO TABS: 1.0000 | ORAL_TABLET | Freq: Two times a day (BID) | ORAL | 0 refills | Status: DC

## 2018-01-14 MED ORDER — BENZONATATE 200 MG PO CAPS: ORAL_CAPSULE | ORAL | 0 refills | Status: AC

## 2018-01-14 MED ORDER — PREDNISONE 20 MG PO TABS: ORAL_TABLET | ORAL | 0 refills | Status: DC

## 2018-01-14 NOTE — Discharge Instructions (Addendum)
Take plain guaifenesin (1200mg  extended release tabs such as Mucinex) twice daily, with plenty of water, for cough and congestion.   Get adequate rest.   May use Afrin nasal spray (or generic oxymetazoline) each morning for about 5 days and then discontinue.  Also recommend using saline nasal spray several times daily and saline nasal irrigation (AYR is a common brand).  Use Flonase nasal spray each morning after using Afrin nasal spray and saline nasal irrigation. Try warm salt water gargles for sore throat.  Stop all antihistamines for now, and other non-prescription cough/cold preparations. Begin Augmentin if not improving about one week or if persistent fever develops

## 2018-01-14 NOTE — ED Triage Notes (Signed)
Patient gives 3 day history of congestion, cough, ear problems, sore throat; denies fever; did have influenza immunization.

## 2018-01-14 NOTE — ED Provider Notes (Signed)
[name]    CSN: [id number] Arrival date & time: 01/14/18  0948     History   Chief Complaint Chief Complaint  Patient presents with  . Nasal Congestion  . Cough  . Ear Problem  . Sore Throat    HPI Daniel Ortega is a 57 y.o. male.   Patient complains of four day history of typical cold-like symptoms developing over several days, including mild sore throat, sinus congestion, headache, fatigue, and cough.  He denies fevers, chills, and sweats.  The history is provided by the patient.    Past Medical History:  Diagnosis Date  . Anxiety   . Anxiety and depression   . Hyperlipemia   . Hyperlipemia   . Hypertension   . Parkinson's disease ([place])   . Seasonal allergies     Patient Active Problem List   Diagnosis Date Noted  . Paroxysmal supraventricular tachycardia ([place]) 06/03/2014  . Allergic rhinitis 05/30/2014  . Anxiety 05/30/2014  . Arthritis 05/30/2014  . Abnormal prostate specific antigen 05/30/2014  . Acid reflux 05/30/2014  . 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 HLD (hyperlipidemia) 05/30/2014  . Elevated fasting blood sugar 05/30/2014  . Atrial paroxysmal tachycardia ([place]) 05/30/2014  . Has a tremor 04/15/2014  . Essential (primary) hypertension 10/08/2013  . Continuous opioid dependence ([place]) 04/05/2013  . Family history of colonic polyps 03/24/2012  . Decreased leukocytes 03/24/2012  . Cervical radiculitis 06/11/2011  . Lumbar radiculopathy 06/11/2011  . Mild cognitive disorder 06/11/2011  . Chewing tobacco use 04/06/2011  . Family history of coronary arteriosclerosis 04/06/2011    Past Surgical History:  Procedure Laterality Date  . ADENOIDECTOMY    . KNEE SURGERY    . NECK SURGERY    . TONSILLECTOMY         Home Medications    Prior to Admission medications   Medication Sig Start Date End Date Taking? Authorizing Provider  Apomorphine HCl (APOKYN) 30 MG/3ML SOCT Inject into the skin.   Yes [provider]    Carbidopa-Levodopa ER (SINEMET CR) 25-100 MG tablet controlled release Take 1 tablet by mouth 2 (two) times daily.   Yes [provider]  gabapentin (NEURONTIN) 100 MG capsule Take 100 mg by mouth 3 (three) times daily.   Yes [provider]  Pimavanserin Tartrate (NUPLAZID) 10 MG TABS Take by mouth.   Yes [provider]  rasagiline (AZILECT) 0.5 MG TABS tablet Take 0.5 mg by mouth daily.   Yes [provider]  tamsulosin (FLOMAX) 0.4 MG CAPS capsule Take 0.4 mg by mouth.   Yes [provider]  ALPRAZolam Duanne Moron) 0.5 MG tablet TAKE ONE TABLET BY MOUTH 3 TIMES A DAY 05/09/14   [provider]  amoxicillin-clavulanate (AUGMENTIN) 875-125 MG tablet Take 1 tablet by mouth 2 (two) times daily. Take with food (Rx void after 01/22/18) 01/14/18   Kandra Nicolas, MD  aspirin 81 MG chewable tablet Chew 81 mg by mouth.    [provider]  benzonatate (TESSALON) 200 MG capsule Take one cap by mouth at bedtime as needed for cough.  May repeat in 4 to 6 hours 01/14/18   Kandra Nicolas, MD  diclofenac (VOLTAREN) 75 MG EC tablet Take 75 mg by mouth. 01/24/12   [provider]  DULoxetine (CYMBALTA) 60 MG capsule Take 60 mg by mouth.    [provider]  escitalopram (LEXAPRO) 10 MG tablet Take 10 mg by mouth daily.    [provider]  olmesartan [facility])  20 MG tablet Take 20 mg by mouth daily.    [provider]  predniSONE (DELTASONE) 20 MG tablet Take one tab by mouth twice daily for 5 days, then one daily for 3 days. Take with food. 01/14/18   Kandra Nicolas, MD  simvastatin (ZOCOR) 40 MG tablet Take 40 mg by mouth. 05/09/14   [provider]  TIZANIDINE HCL PO Take by mouth.    [provider]  UNKNOWN TO PATIENT Anti-hypertensive    [provider]  valsartan-hydrochlorothiazide (DIOVAN-HCT) 320-25 MG per tablet Take 1 tablet by mouth. 05/27/14 05/27/15  [provider]     Family History Family History  Problem Relation Age of Onset  . Hypertension Mother   . Anxiety disorder Mother   . Hypertension Father   . Anxiety disorder Father   . Hypertension Brother   . Anxiety disorder Brother     Social History Social History   Tobacco Use  . Smoking status: Never Smoker  Substance Use Topics  . Alcohol use: No  . Drug use: No     Allergies   Bee venom; Atorvastatin; Codeine; and Metoprolol tartrate   Review of Systems Review of Systems + sore throat + cough No pleuritic pain No wheezing + nasal congestion + post-nasal drainage + sinus pain/pressure No itchy/red eyes No earache but ears feel clogged No hemoptysis No SOB No fever/chills No nausea No vomiting No abdominal pain No diarrhea No urinary symptoms No skin rash + fatigue + myalgias + headache Used OTC meds without relief   Physical Exam Triage Vital Signs ED Triage Vitals [01/14/18 1037]  Enc Vitals Group     BP 130/79     Pulse Rate (!) 57     Resp 18     Temp 97.8 F (36.6 C)     Temp Source Oral     SpO2 98 %     Weight 200 lb (90.7 kg)     Height 5\' 9"  (1.753 m)     Head Circumference      Peak Flow      Pain Score 1     Pain Loc      Pain Edu?      Excl. in [place]?    No data found.  Updated Vital Signs BP 130/79 (BP Location: Right Arm)   Pulse (!) 57   Temp 97.8 F (36.6 C) (Oral)   Resp 18   Ht 5\' 9"  (1.753 m)   Wt 90.7 kg   SpO2 98%   BMI 29.53 kg/m   Visual Acuity Right Eye Distance:   Left Eye Distance:   Bilateral Distance:    Right Eye Near:   Left Eye Near:    Bilateral Near:     Physical Exam Nursing notes and Vital Signs reviewed. Appearance:  Patient appears stated age, and in no acute distress Eyes:  Pupils are equal, round, and reactive to light and accomodation.  Extraocular movement is intact.  Conjunctivae are not inflamed  Ears:  Canals normal.  Tympanic membranes normal.  Nose:  Mildly congested turbinates.   No sinus tenderness.  Pharynx:  Normal Neck:  Supple.  Enlarged nontender posterior/lateral nodes are palpated bilaterally. Lungs:  Clear to auscultation.  Breath sounds are equal.  Moving air well. Heart:  Regular rate and rhythm without murmurs, rubs, or gallops.  Abdomen:  Nontender without masses or hepatosplenomegaly.  Bowel sounds are present.  No CVA or flank tenderness.  Extremities:  No edema.  Skin:  No rash present.    UC Treatments / Results  Labs (all labs ordered are listed, but only abnormal results are displayed) Labs Reviewed - No data to display  EKG None  Radiology No results found.  Procedures Procedures (including critical care time)  Medications Ordered in UC Medications - No data to display  Initial Impression / Assessment and Plan / UC Course  I have reviewed the triage vital signs and the nursing notes.  Pertinent labs & imaging results that were available during my care of the patient were reviewed by me and considered in my medical decision making (see chart for details).     There is no evidence of bacterial infection today.  Treat symptomatically for now  Begin prednisone burst/taper. Prescription written for Benzonatate [facility]) to take at bedtime for night-time cough.  Followup with Family Doctor if not improved in about 10 days.  Final Clinical Impressions(s) / UC Diagnoses   Final diagnoses:  Viral URI with cough     Discharge Instructions     Take plain guaifenesin (1200mg  extended release tabs such as Mucinex) twice daily, with plenty of water, for cough and congestion.   Get adequate rest.   May use Afrin nasal spray (or generic oxymetazoline) each morning for about 5 days and then discontinue.  Also recommend using saline nasal spray several times daily and saline nasal irrigation (AYR is a common brand).  Use Flonase nasal spray each morning after using Afrin nasal spray and saline nasal irrigation. Try warm salt water gargles  for sore throat.  Stop all antihistamines for now, and other non-prescription cough/cold preparations. Begin Augmentin if not improving about one week or if persistent fever develops     ED Prescriptions    Medication Sig Dispense Auth. Provider   predniSONE (DELTASONE) 20 MG tablet Take one tab by mouth twice daily for 5 days, then one daily for 3 days. Take with food. 13 tablet Kandra Nicolas, MD   benzonatate (TESSALON) 200 MG capsule Take one cap by mouth at bedtime as needed for cough.  May repeat in 4 to 6 hours 15 capsule Kandra Nicolas, MD   amoxicillin-clavulanate (AUGMENTIN) 875-125 MG tablet Take 1 tablet by mouth 2 (two) times daily. Take with food (Rx void after 01/22/18) 20 tablet Kandra Nicolas, MD         Kandra Nicolas, MD 01/14/18 1100

## 2018-02-09 DIAGNOSIS — G5603 Carpal tunnel syndrome, bilateral upper limbs: Secondary | ICD-10-CM | POA: Diagnosis not present

## 2018-02-09 DIAGNOSIS — G603 Idiopathic progressive neuropathy: Secondary | ICD-10-CM | POA: Diagnosis not present

## 2018-02-09 DIAGNOSIS — M545 Low back pain: Secondary | ICD-10-CM | POA: Diagnosis not present

## 2018-02-09 DIAGNOSIS — R270 Ataxia, unspecified: Secondary | ICD-10-CM | POA: Diagnosis not present

## 2018-02-09 DIAGNOSIS — G20 Parkinson's disease: Secondary | ICD-10-CM | POA: Diagnosis not present

## 2018-02-25 DIAGNOSIS — R000 Tachycardia, unspecified: Secondary | ICD-10-CM | POA: Diagnosis not present

## 2018-02-25 DIAGNOSIS — Z888 Allergy status to other drugs, medicaments and biological substances status: Secondary | ICD-10-CM | POA: Diagnosis not present

## 2018-02-25 DIAGNOSIS — Z79899 Other long term (current) drug therapy: Secondary | ICD-10-CM | POA: Diagnosis not present

## 2018-02-25 DIAGNOSIS — E785 Hyperlipidemia, unspecified: Secondary | ICD-10-CM | POA: Diagnosis not present

## 2018-02-25 DIAGNOSIS — Z885 Allergy status to narcotic agent status: Secondary | ICD-10-CM | POA: Diagnosis not present

## 2018-02-25 DIAGNOSIS — Z91030 Bee allergy status: Secondary | ICD-10-CM | POA: Diagnosis not present

## 2018-02-25 DIAGNOSIS — I10 Essential (primary) hypertension: Secondary | ICD-10-CM | POA: Diagnosis not present

## 2018-02-25 DIAGNOSIS — G20 Parkinson's disease: Secondary | ICD-10-CM | POA: Diagnosis not present

## 2018-02-25 DIAGNOSIS — R69 Illness, unspecified: Secondary | ICD-10-CM | POA: Diagnosis not present

## 2018-02-25 DIAGNOSIS — Z7982 Long term (current) use of aspirin: Secondary | ICD-10-CM | POA: Diagnosis not present

## 2018-02-25 DIAGNOSIS — R002 Palpitations: Secondary | ICD-10-CM | POA: Diagnosis not present

## 2018-03-23 ENCOUNTER — Emergency Department
Admission: EM | Admit: 2018-03-23 | Discharge: 2018-03-23 | Disposition: A | Discharge status: Home / Self Care | Payer: Medicare HMO | Source: Home / Self Care

## 2018-03-23 ENCOUNTER — Other Ambulatory Visit: Payer: Self-pay

## 2018-03-23 DIAGNOSIS — N3001 Acute cystitis with hematuria: Secondary | ICD-10-CM | POA: Diagnosis not present

## 2018-03-23 LAB — POCT URINALYSIS DIP (MANUAL ENTRY)
Glucose, UA: NEGATIVE mg/dL
Ketones, POC UA: NEGATIVE mg/dL
Nitrite, UA: POSITIVE — AB
Protein Ur, POC: >=300 mg/dL — AB
Spec Grav, UA: 1.015 (ref 1.010–1.025)
Urobilinogen, UA: 1 E.U./dL
pH, UA: 7 (ref 5.0–8.0)

## 2018-03-23 MED ORDER — SULFAMETHOXAZOLE-TRIMETHOPRIM 800-160 MG PO TABS: 1.0000 | ORAL_TABLET | Freq: Two times a day (BID) | ORAL | 0 refills | Status: AC

## 2018-03-23 NOTE — Discharge Instructions (Signed)
°  Please take antibiotics as prescribed and be sure to complete entire course even if you start to feel better to ensure infection does not come back.  Please follow up with your urologist as previously scheduled.    Please take your antibiotic as prescribed. A urine culture has been sent to check the severity of your urinary infection and to determine if you are on the most appropriate antibiotic. The results should come back within 2-3 days and you will be notified even if no medication change is needed.  You may also take over the counter Azo to help with your symptoms.

## 2018-03-23 NOTE — ED Triage Notes (Signed)
Pt came into clinic today for dysuria, onset yesterday. Reports increase in frequency and some hematuria. Denies fever or flank pain.

## 2018-03-23 NOTE — ED Provider Notes (Signed)
[name]    CSN: [id number] Arrival date & time: 03/23/18  1317     History   Chief Complaint Chief Complaint  Patient presents with  . Dysuria    HPI Daniel Ortega is a 58 y.o. male.   HPI  Daniel Ortega is a 58 y.o. male presenting to UC with c/o sudden onset dysuria that started yesterday, frequency and hematuria started today. No hx of UTIs in the past. Hx of BPH but no hx of prostatitis.  Denies fever, chills, n/v/d or back pain.  Denies concern for STIs. He takes his Flomax as prescribed. He has routine f/u with his urologist scheduled for next week.    Past Medical History:  Diagnosis Date  . Anxiety   . Anxiety and depression   . Hyperlipemia   . Hyperlipemia   . Hypertension   . Parkinson's disease ([place])   . Seasonal allergies     Patient Active Problem List   Diagnosis Date Noted  . Paroxysmal supraventricular tachycardia ([place]) 06/03/2014  . Allergic rhinitis 05/30/2014  . Anxiety 05/30/2014  . Arthritis 05/30/2014  . Abnormal prostate specific antigen 05/30/2014  . Acid reflux 05/30/2014  . 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 HLD (hyperlipidemia) 05/30/2014  . Elevated fasting blood sugar 05/30/2014  . Atrial paroxysmal tachycardia ([place]) 05/30/2014  . Has a tremor 04/15/2014  . Essential (primary) hypertension 10/08/2013  . Continuous opioid dependence ([place]) 04/05/2013  . Family history of colonic polyps 03/24/2012  . Decreased leukocytes 03/24/2012  . Cervical radiculitis 06/11/2011  . Lumbar radiculopathy 06/11/2011  . Mild cognitive disorder 06/11/2011  . Chewing tobacco use 04/06/2011  . Family history of coronary arteriosclerosis 04/06/2011    Past Surgical History:  Procedure Laterality Date  . ADENOIDECTOMY    . KNEE SURGERY    . NECK SURGERY    . TONSILLECTOMY         Home Medications    Prior to Admission medications   Medication Sig Start Date End Date Taking? Authorizing Provider  ALPRAZolam (XANAX) 0.5 MG  tablet TAKE ONE TABLET BY MOUTH 3 TIMES A DAY 05/09/14  Yes [provider]  Apomorphine HCl (APOKYN) 30 MG/3ML SOCT Inject into the skin.   Yes [provider]  aspirin 81 MG chewable tablet Chew 81 mg by mouth.   Yes [provider]  Carbidopa-Levodopa ER (SINEMET CR) 25-100 MG tablet controlled release Take 1 tablet by mouth 2 (two) times daily.   Yes [provider]  diclofenac (VOLTAREN) 75 MG EC tablet Take 75 mg by mouth. 01/24/12  Yes [provider]  DULoxetine (CYMBALTA) 60 MG capsule Take 60 mg by mouth.   Yes [provider]  escitalopram (LEXAPRO) 10 MG tablet Take 10 mg by mouth daily.   Yes [provider]  gabapentin (NEURONTIN) 100 MG capsule Take 100 mg by mouth 3 (three) times daily.   Yes [provider]  olmesartan (BENICAR) 20 MG tablet Take 20 mg by mouth daily.   Yes [provider]  Pimavanserin Tartrate (NUPLAZID) 10 MG TABS Take by mouth.   Yes [provider]  rasagiline (AZILECT) 0.5 MG TABS tablet Take 0.5 mg by mouth daily.   Yes [provider]  simvastatin (ZOCOR) 40 MG tablet Take 40 mg by mouth. 05/09/14  Yes [provider]  tamsulosin (FLOMAX) 0.4 MG CAPS capsule Take 0.4 mg by mouth.   Yes [provider]  TIZANIDINE HCL PO Take by mouth.   Yes [provider]  UNKNOWN TO PATIENT Anti-hypertensive   Yes [provider]  amoxicillin-clavulanate (AUGMENTIN) 875-125 MG tablet Take 1 tablet by mouth 2 (two) times daily. Take with food (Rx void after 01/22/18) 01/14/18   Kandra Nicolas, MD  benzonatate (TESSALON) 200 MG capsule Take one cap by mouth at bedtime as needed for cough.  May repeat in 4 to 6 hours 01/14/18   Kandra Nicolas, MD  predniSONE (DELTASONE) 20 MG tablet Take one tab by mouth twice daily for 5 days, then one daily for 3 days. Take with food. 01/14/18   Kandra Nicolas, MD  sulfamethoxazole-trimethoprim (BACTRIM  DS,SEPTRA DS) 800-160 MG tablet Take 1 tablet by mouth 2 (two) times daily for 7 days. 03/23/18 03/30/18  Noe Gens, PA-C  valsartan-hydrochlorothiazide (DIOVAN-HCT) 320-25 MG per tablet Take 1 tablet by mouth. 05/27/14 05/27/15  [provider]    Family History Family History  Problem Relation Age of Onset  . Hypertension Mother   . Anxiety disorder Mother   . Hypertension Father   . Anxiety disorder Father   . Hypertension Brother   . Anxiety disorder Brother     Social History Social History   Tobacco Use  . Smoking status: Never Smoker  . Smokeless tobacco: Current User    Types: Chew  Substance Use Topics  . Alcohol use: No  . Drug use: No     Allergies   Bee venom; Atorvastatin; Codeine; and Metoprolol tartrate   Review of Systems Review of Systems  Constitutional: Negative for chills and fever.  Gastrointestinal: Negative for abdominal pain and nausea.  Genitourinary: Positive for dysuria, frequency, hematuria and urgency. Negative for discharge, flank pain, scrotal swelling and testicular pain.  Musculoskeletal: Negative for back pain.  Neurological: Negative for dizziness and headaches.     Physical Exam Triage Vital Signs ED Triage Vitals  Enc Vitals Group     BP      Pulse      Resp      Temp      Temp src      SpO2      Weight      Height      Head Circumference      Peak Flow      Pain Score      Pain Loc      Pain Edu?      Excl. in [place]?    No data found.  Updated Vital Signs BP 134/85 (BP Location: Right Arm)   Pulse (!) 59   Temp (!) 97.5 F (36.4 C) (Oral)   Ht 5\' 9"  (1.753 m)   Wt 194 lb (88 kg)   SpO2 97%   BMI 28.65 kg/m   Visual Acuity Right Eye Distance:   Left Eye Distance:   Bilateral Distance:    Right Eye Near:   Left Eye Near:    Bilateral Near:     Physical Exam Vitals signs and nursing note reviewed.  Constitutional:      Appearance: Normal appearance. He is well-developed.  HENT:     Head:  Normocephalic and atraumatic.  Neck:     Musculoskeletal: Normal range of motion.  Cardiovascular:     Rate and Rhythm: Normal rate and regular rhythm.  Pulmonary:     Effort: Pulmonary effort is normal.     Breath sounds: Normal breath sounds.  Abdominal:     Palpations: Abdomen is soft.     Tenderness: There is no  abdominal tenderness. There is no right CVA tenderness or left CVA tenderness.  Musculoskeletal: Normal range of motion.  Skin:    General: Skin is warm and dry.  Neurological:     Mental Status: He is alert and oriented to person, place, and time.  Psychiatric:        Behavior: Behavior normal.      UC Treatments / Results  Labs (all labs ordered are listed, but only abnormal results are displayed) Labs Reviewed  POCT URINALYSIS DIP (MANUAL ENTRY) - Abnormal; Notable for the following components:      Result Value   Color, UA red (*)    Bilirubin, UA small (*)    Blood, UA large (*)    Protein Ur, POC >=300 (*)    Nitrite, UA Positive (*)    Leukocytes, UA Large (3+) (*)    All other components within normal limits  URINE CULTURE    EKG None  Radiology No results found.  Procedures Procedures (including critical care time)  Medications Ordered in UC Medications - No data to display  Initial Impression / Assessment and Plan / UC Course  I have reviewed the triage vital signs and the nursing notes.  Pertinent labs & imaging results that were available during my care of the patient were reviewed by me and considered in my medical decision making (see chart for details).     UA c/w UTI Culture sent Home care info provided  Final Clinical Impressions(s) / UC Diagnoses   Final diagnoses:  Acute cystitis with hematuria     Discharge Instructions      Please take antibiotics as prescribed and be sure to complete entire course even if you start to feel better to ensure infection does not come back.  Please follow up with your urologist as  previously scheduled.    Please take your antibiotic as prescribed. A urine culture has been sent to check the severity of your urinary infection and to determine if you are on the most appropriate antibiotic. The results should come back within 2-3 days and you will be notified even if no medication change is needed.  You may also take over the counter Azo to help with your symptoms.      ED Prescriptions    Medication Sig Dispense Auth. Provider   sulfamethoxazole-trimethoprim (BACTRIM DS,SEPTRA DS) 800-160 MG tablet Take 1 tablet by mouth 2 (two) times daily for 7 days. 14 tablet Noe Gens, [place]     Controlled Substance Prescriptions [place] Controlled Substance Registry consulted? Not Applicable   Tyrell Antonio 03/23/18 1431

## 2018-03-25 LAB — URINE CULTURE
MICRO NUMBER:: 129691
SPECIMEN QUALITY:: ADEQUATE

## 2018-03-26 ENCOUNTER — Telehealth: Payer: Self-pay | Admitting: Emergency Medicine

## 2018-03-26 NOTE — Telephone Encounter (Signed)
Per Dr.Beese; called LVM that patient needed different rx for his cystitis due to sensitivity results; calling [facility] with amoxicillin 875mg  po bid x 7 days.

## 2018-03-29 DIAGNOSIS — N521 Erectile dysfunction due to diseases classified elsewhere: Secondary | ICD-10-CM | POA: Diagnosis not present

## 2018-03-29 DIAGNOSIS — R310 Gross hematuria: Secondary | ICD-10-CM | POA: Diagnosis not present

## 2018-03-29 DIAGNOSIS — R399 Unspecified symptoms and signs involving the genitourinary system: Secondary | ICD-10-CM | POA: Diagnosis not present

## 2018-03-29 DIAGNOSIS — R972 Elevated prostate specific antigen [PSA]: Secondary | ICD-10-CM | POA: Diagnosis not present

## 2018-04-20 DIAGNOSIS — Z79899 Other long term (current) drug therapy: Secondary | ICD-10-CM | POA: Diagnosis not present

## 2018-04-20 DIAGNOSIS — R1310 Dysphagia, unspecified: Secondary | ICD-10-CM | POA: Diagnosis not present

## 2018-04-20 DIAGNOSIS — R202 Paresthesia of skin: Secondary | ICD-10-CM | POA: Diagnosis not present

## 2018-04-20 DIAGNOSIS — G20 Parkinson's disease: Secondary | ICD-10-CM | POA: Diagnosis not present

## 2018-04-20 DIAGNOSIS — G3184 Mild cognitive impairment, so stated: Secondary | ICD-10-CM | POA: Diagnosis not present

## 2018-04-20 DIAGNOSIS — R296 Repeated falls: Secondary | ICD-10-CM | POA: Diagnosis not present

## 2018-04-20 DIAGNOSIS — R270 Ataxia, unspecified: Secondary | ICD-10-CM | POA: Diagnosis not present

## 2018-04-20 DIAGNOSIS — R4189 Other symptoms and signs involving cognitive functions and awareness: Secondary | ICD-10-CM | POA: Diagnosis not present

## 2018-04-20 DIAGNOSIS — M545 Low back pain: Secondary | ICD-10-CM | POA: Diagnosis not present

## 2018-06-21 DIAGNOSIS — R972 Elevated prostate specific antigen [PSA]: Secondary | ICD-10-CM | POA: Diagnosis not present

## 2018-06-21 DIAGNOSIS — R7301 Impaired fasting glucose: Secondary | ICD-10-CM | POA: Diagnosis not present

## 2018-06-21 DIAGNOSIS — R69 Illness, unspecified: Secondary | ICD-10-CM | POA: Diagnosis not present

## 2018-06-21 DIAGNOSIS — G20 Parkinson's disease: Secondary | ICD-10-CM | POA: Diagnosis not present

## 2018-06-21 DIAGNOSIS — I10 Essential (primary) hypertension: Secondary | ICD-10-CM | POA: Diagnosis not present

## 2018-06-21 DIAGNOSIS — I471 Supraventricular tachycardia: Secondary | ICD-10-CM | POA: Diagnosis not present

## 2018-06-21 DIAGNOSIS — E785 Hyperlipidemia, unspecified: Secondary | ICD-10-CM | POA: Diagnosis not present

## 2018-06-29 DIAGNOSIS — R270 Ataxia, unspecified: Secondary | ICD-10-CM | POA: Diagnosis not present

## 2018-06-29 DIAGNOSIS — M542 Cervicalgia: Secondary | ICD-10-CM | POA: Diagnosis not present

## 2018-06-29 DIAGNOSIS — G3184 Mild cognitive impairment, so stated: Secondary | ICD-10-CM | POA: Diagnosis not present

## 2018-06-29 DIAGNOSIS — G20 Parkinson's disease: Secondary | ICD-10-CM | POA: Diagnosis not present

## 2018-06-29 DIAGNOSIS — M545 Low back pain: Secondary | ICD-10-CM | POA: Diagnosis not present

## 2018-07-05 DIAGNOSIS — M25562 Pain in left knee: Secondary | ICD-10-CM | POA: Diagnosis not present

## 2018-07-05 DIAGNOSIS — M25561 Pain in right knee: Secondary | ICD-10-CM | POA: Diagnosis not present

## 2018-09-07 DIAGNOSIS — G3184 Mild cognitive impairment, so stated: Secondary | ICD-10-CM | POA: Diagnosis not present

## 2018-09-07 DIAGNOSIS — R270 Ataxia, unspecified: Secondary | ICD-10-CM | POA: Diagnosis not present

## 2018-09-07 DIAGNOSIS — R69 Illness, unspecified: Secondary | ICD-10-CM | POA: Diagnosis not present

## 2018-09-07 DIAGNOSIS — M545 Low back pain: Secondary | ICD-10-CM | POA: Diagnosis not present

## 2018-09-07 DIAGNOSIS — G20 Parkinson's disease: Secondary | ICD-10-CM | POA: Diagnosis not present

## 2018-09-07 DIAGNOSIS — M542 Cervicalgia: Secondary | ICD-10-CM | POA: Diagnosis not present

## 2018-09-13 DIAGNOSIS — R41841 Cognitive communication deficit: Secondary | ICD-10-CM | POA: Diagnosis not present

## 2018-09-13 DIAGNOSIS — R498 Other voice and resonance disorders: Secondary | ICD-10-CM | POA: Diagnosis not present

## 2018-09-13 DIAGNOSIS — R2689 Other abnormalities of gait and mobility: Secondary | ICD-10-CM | POA: Diagnosis not present

## 2018-09-13 DIAGNOSIS — Z789 Other specified health status: Secondary | ICD-10-CM | POA: Diagnosis not present

## 2018-09-13 DIAGNOSIS — R972 Elevated prostate specific antigen [PSA]: Secondary | ICD-10-CM | POA: Diagnosis not present

## 2018-09-13 DIAGNOSIS — Z7409 Other reduced mobility: Secondary | ICD-10-CM | POA: Diagnosis not present

## 2018-09-13 DIAGNOSIS — R1310 Dysphagia, unspecified: Secondary | ICD-10-CM | POA: Diagnosis not present

## 2018-09-13 DIAGNOSIS — Z741 Need for assistance with personal care: Secondary | ICD-10-CM | POA: Diagnosis not present

## 2018-09-13 DIAGNOSIS — R69 Illness, unspecified: Secondary | ICD-10-CM | POA: Diagnosis not present

## 2018-09-13 DIAGNOSIS — M1990 Unspecified osteoarthritis, unspecified site: Secondary | ICD-10-CM | POA: Diagnosis not present

## 2018-09-13 DIAGNOSIS — E785 Hyperlipidemia, unspecified: Secondary | ICD-10-CM | POA: Diagnosis not present

## 2018-09-13 DIAGNOSIS — G20 Parkinson's disease: Secondary | ICD-10-CM | POA: Diagnosis not present

## 2018-10-05 ENCOUNTER — Other Ambulatory Visit: Payer: Self-pay

## 2018-10-05 ENCOUNTER — Emergency Department
Admission: EM | Admit: 2018-10-05 | Discharge: 2018-10-05 | Disposition: A | Discharge status: Home / Self Care | Payer: Medicare HMO | Source: Home / Self Care

## 2018-10-05 ENCOUNTER — Emergency Department (INDEPENDENT_AMBULATORY_CARE_PROVIDER_SITE_OTHER): Payer: Medicare HMO

## 2018-10-05 DIAGNOSIS — M549 Dorsalgia, unspecified: Secondary | ICD-10-CM | POA: Diagnosis not present

## 2018-10-05 DIAGNOSIS — R932 Abnormal findings on diagnostic imaging of liver and biliary tract: Secondary | ICD-10-CM

## 2018-10-05 DIAGNOSIS — R109 Unspecified abdominal pain: Secondary | ICD-10-CM | POA: Diagnosis not present

## 2018-10-05 DIAGNOSIS — K769 Liver disease, unspecified: Secondary | ICD-10-CM

## 2018-10-05 LAB — POCT URINALYSIS DIP (MANUAL ENTRY)
Bilirubin, UA: NEGATIVE
Blood, UA: NEGATIVE
Glucose, UA: NEGATIVE mg/dL
Ketones, POC UA: NEGATIVE mg/dL
Leukocytes, UA: NEGATIVE
Nitrite, UA: NEGATIVE
Protein Ur, POC: NEGATIVE mg/dL
Spec Grav, UA: 1.015 (ref 1.010–1.025)
Urobilinogen, UA: 0.2 E.U./dL
pH, UA: 7 (ref 5.0–8.0)

## 2018-10-05 NOTE — Discharge Instructions (Signed)
°  Your urine and kidney ultrasound were normal today. Your pain is likely from a strained group of muscles on your Left side. You may continue to take your prescribed pain medication.   There was an incidental finding of a liver lesion seen on your ultrasound today.  It is recommended you follow up with your family doctor to have a CT scan ordered.  Call 911 or go to the hospital if symptoms worsening.

## 2018-10-05 NOTE — ED Provider Notes (Signed)
[name]    CSN: [id number] Arrival date & time: 10/05/18  3329     History   Chief Complaint Chief Complaint  Patient presents with  . Back Pain    HPI Daniel Ortega is a 58 y.o. male.   HPI  Daniel Ortega is a 58 y.o. male presenting to UC with c/o Left side back/flank pain that started about 3 days ago. Pt has hx of chronic back and joint pain secondary to Parkinson's but this current pain feels different. Normal pain is aching and sore but this pain is sharp and stabbing at times.  Denies radiation to arms or legs. No urinary symptoms or hx of kidney stones. No fever or chills. No rashes. He takes hydrocodone, gabapentin, and tizanidine for his Parkinson's but no relief of current pain.    Past Medical History:  Diagnosis Date  . Anxiety   . Anxiety and depression   . Hyperlipemia   . Hyperlipemia   . Hypertension   . Parkinson's disease ([place])   . Seasonal allergies     Patient Active Problem List   Diagnosis Date Noted  . Paroxysmal supraventricular tachycardia ([place]) 06/03/2014  . Allergic rhinitis 05/30/2014  . Anxiety 05/30/2014  . Arthritis 05/30/2014  . Abnormal prostate specific antigen 05/30/2014  . Acid reflux 05/30/2014  . 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 HLD (hyperlipidemia) 05/30/2014  . Elevated fasting blood sugar 05/30/2014  . Atrial paroxysmal tachycardia ([place]) 05/30/2014  . Has a tremor 04/15/2014  . Essential (primary) hypertension 10/08/2013  . Continuous opioid dependence ([place]) 04/05/2013  . Family history of colonic polyps 03/24/2012  . Decreased leukocytes 03/24/2012  . Cervical radiculitis 06/11/2011  . Lumbar radiculopathy 06/11/2011  . Mild cognitive disorder 06/11/2011  . Chewing tobacco use 04/06/2011  . Family history of coronary arteriosclerosis 04/06/2011    Past Surgical History:  Procedure Laterality Date  . ADENOIDECTOMY    . KNEE SURGERY    . NECK SURGERY    . TONSILLECTOMY         Home  Medications    Prior to Admission medications   Medication Sig Start Date End Date Taking? Authorizing Provider  rOPINIRole (REQUIP) 0.5 MG tablet 1/2 TAB BY MOUTH 3 TIMES A DAY X 1 WK AND THEN 1 TAB BY MOUTH 3 TIMES A DAY FOR 30 DAYS 04/20/18  Yes [provider]  ALPRAZolam (XANAX) 0.5 MG tablet TAKE ONE TABLET BY MOUTH 3 TIMES A DAY 05/09/14   [provider]  amoxicillin-clavulanate (AUGMENTIN) 875-125 MG tablet Take 1 tablet by mouth 2 (two) times daily. Take with food (Rx void after 01/22/18) 01/14/18   Kandra Nicolas, MD  Apomorphine HCl (APOKYN) 30 MG/3ML SOCT Inject into the skin.    [provider]  aspirin 81 MG chewable tablet Chew 81 mg by mouth.    [provider]  benzonatate (TESSALON) 200 MG capsule Take one cap by mouth at bedtime as needed for cough.  May repeat in 4 to 6 hours 01/14/18   Kandra Nicolas, MD  Carbidopa-Levodopa ER (SINEMET CR) 25-100 MG tablet controlled release Take 1 tablet by mouth 2 (two) times daily.    [provider]  diclofenac (VOLTAREN) 75 MG EC tablet Take 75 mg by mouth. 01/24/12   [provider]  DULoxetine (CYMBALTA) 60 MG capsule Take 60 mg by mouth.    [provider]  escitalopram (LEXAPRO) 10 MG tablet Take 10 mg by mouth daily.    [provider]  gabapentin (NEURONTIN) 100 MG capsule Take 100 mg by mouth 3 (three) times daily.    [provider]  olmesartan (BENICAR) 20 MG tablet Take 20 mg by mouth daily.    [provider]  Pimavanserin Tartrate (NUPLAZID) 10 MG TABS Take by mouth.    [provider]  predniSONE (DELTASONE) 20 MG tablet Take one tab by mouth twice daily for 5 days, then one daily for 3 days. Take with food. 01/14/18   Kandra Nicolas, MD  rasagiline (AZILECT) 0.5 MG TABS tablet Take 0.5 mg by mouth daily.    [provider]  simvastatin (ZOCOR) 40 MG tablet Take 40 mg by mouth. 05/09/14   [provider]   tamsulosin (FLOMAX) 0.4 MG CAPS capsule Take 0.4 mg by mouth.    [provider]  TIZANIDINE HCL PO Take by mouth.    [provider]  UNKNOWN TO PATIENT Anti-hypertensive    [provider]  valsartan-hydrochlorothiazide (DIOVAN-HCT) 320-25 MG per tablet Take 1 tablet by mouth. 05/27/14 05/27/15  [provider]    Family History Family History  Problem Relation Age of Onset  . Hypertension Mother   . Anxiety disorder Mother   . Hypertension Father   . Anxiety disorder Father   . Hypertension Brother   . Anxiety disorder Brother     Social History Social History   Tobacco Use  . Smoking status: Never Smoker  . Smokeless tobacco: Current User    Types: Chew  Substance Use Topics  . Alcohol use: No  . Drug use: No     Allergies   Bee venom, Atorvastatin, Codeine, and Metoprolol tartrate   Review of Systems Review of Systems  Constitutional: Negative for chills and fever.  Gastrointestinal: Negative for abdominal pain, nausea and vomiting.  Genitourinary: Positive for flank pain (Left). Negative for dysuria, frequency, hematuria, testicular pain and urgency.  Musculoskeletal: Positive for back pain and myalgias.  Neurological: Positive for tremors (chronic, Parkinson's). Negative for dizziness, weakness, light-headedness and headaches.     Physical Exam Triage Vital Signs ED Triage Vitals  Enc Vitals Group     BP 10/05/18 0913 (!) 155/93     Pulse Rate 10/05/18 0913 87     Resp 10/05/18 0913 20     Temp 10/05/18 0913 98.1 F (36.7 C)     Temp Source 10/05/18 0913 Oral     SpO2 10/05/18 0913 97 %     Weight 10/05/18 0914 193 lb (87.5 kg)     Height 10/05/18 0914 5\' 9"  (1.753 m)     Head Circumference --      Peak Flow --      Pain Score 10/05/18 0914 7     Pain Loc --      Pain Edu? --      Excl. in [place]? --    No data found.  Updated Vital Signs BP (!) 155/93 (BP Location: Right Arm)   Pulse 87   Temp 98.1 F (36.7 C)  (Oral)   Resp 20   Ht 5\' 9"  (1.753 m)   Wt 193 lb (87.5 kg)   SpO2 97%   BMI 28.50 kg/m   Visual Acuity Right Eye Distance:   Left Eye Distance:   Bilateral Distance:    Right Eye Near:   Left Eye Near:    Bilateral Near:     Physical Exam Vitals signs and nursing note reviewed.  Constitutional:      Appearance:  Normal appearance. He is well-developed.  HENT:     Head: Normocephalic and atraumatic.  Neck:     Musculoskeletal: Normal range of motion.  Cardiovascular:     Rate and Rhythm: Normal rate and regular rhythm.  Pulmonary:     Effort: Pulmonary effort is normal.     Breath sounds: Normal breath sounds.  Abdominal:     General: There is no distension.     Palpations: Abdomen is soft.     Tenderness: There is no abdominal tenderness. There is no right CVA tenderness or left CVA tenderness.  Musculoskeletal: Normal range of motion.        General: Tenderness present.     Comments: Mild tenderness of Left mid thoracic muscles.  Full ROM upper and lower extremities. Normal gait.   Skin:    General: Skin is warm and dry.     Findings: No bruising or erythema.  Neurological:     Mental Status: He is alert and oriented to person, place, and time.  Psychiatric:        Behavior: Behavior normal.      UC Treatments / Results  Labs (all labs ordered are listed, but only abnormal results are displayed) Labs Reviewed  POCT URINALYSIS DIP (MANUAL ENTRY)    EKG   Radiology US Renal  Result Date: 10/05/2018 CLINICAL DATA:  58 year old male with a history of flank pain EXAM: RENAL / URINARY TRACT ULTRASOUND COMPLETE COMPARISON:  None. FINDINGS: Right Kidney: Length: 12.1 cm x 6.5 cm x 6.5 cm, 265 cc. Echogenicity within normal limits. No mass or hydronephrosis visualized. Left Kidney: Length: 11.1 cm x 6.0 cm x 5.5 cm, 190 cc. Echogenicity within normal limits. No mass or hydronephrosis visualized. Bladder: Bilateral ureteral jets. Prostate is enlarged measuring  6.0 cm x 7.4 cm x 5.4 cm, volume of 126 cc. Incidental imaging of a focal hypoechoic lesion in the dome of the liver on the right measuring just less than 2 cm. IMPRESSION: Sonographic survey of the kidneys unremarkable with no hydronephrosis. Incidental imaging of a hypoechoic lesion of the right liver. Further evaluation with contrast-enhanced CT is recommended. Prostatomegaly with an estimated volume of 126 cc Electronically Signed   By: Corrie Mckusick D.O.   On: 10/05/2018 10:24    Procedures Procedures (including critical care time)  Medications Ordered in UC Medications - No data to display  Initial Impression / Assessment and Plan / UC Course  I have reviewed the triage vital signs and the nursing notes.  Pertinent labs & imaging results that were available during my care of the patient were reviewed by me and considered in my medical decision making (see chart for details).     UA: normal Reviewed ultrasound with pt. Reassured normal appearing kidneys and no evidence of kidney stone, however, noted incidental finding on liver. Pt has a PCP he plans to f/u with for additional testing and imaging.  Pain likely due to localized muscle strain. May continue home pain medication. F/u with PCP as needed.  Final Clinical Impressions(s) / UC Diagnoses   Final diagnoses:  Left-sided back pain, unspecified back location, unspecified chronicity  Liver lesion     Discharge Instructions      Your urine and kidney ultrasound were normal today. Your pain is likely from a strained group of muscles on your Left side. You may continue to take your prescribed pain medication.   There was an incidental finding of a liver lesion seen on your ultrasound today.  It is recommended you follow up with your family doctor to have a CT scan ordered.  Call 911 or go to the hospital if symptoms worsening.       ED Prescriptions    None     Controlled Substance Prescriptions [place] Controlled  Substance Registry consulted? Not Applicable   Tyrell Antonio 10/05/18 1243

## 2018-10-05 NOTE — ED Triage Notes (Signed)
Has been having back pain for a couple of months, but the last week it has been different.  Pain in the left kidney area sharp at times.  Takes hydrocodone for parkinson's , and pain is still bad.

## 2018-10-10 DIAGNOSIS — Z7409 Other reduced mobility: Secondary | ICD-10-CM | POA: Diagnosis not present

## 2018-10-10 DIAGNOSIS — M1990 Unspecified osteoarthritis, unspecified site: Secondary | ICD-10-CM | POA: Diagnosis not present

## 2018-10-10 DIAGNOSIS — E785 Hyperlipidemia, unspecified: Secondary | ICD-10-CM | POA: Diagnosis not present

## 2018-10-10 DIAGNOSIS — Z789 Other specified health status: Secondary | ICD-10-CM | POA: Diagnosis not present

## 2018-10-10 DIAGNOSIS — R2689 Other abnormalities of gait and mobility: Secondary | ICD-10-CM | POA: Diagnosis not present

## 2018-10-10 DIAGNOSIS — G20 Parkinson's disease: Secondary | ICD-10-CM | POA: Diagnosis not present

## 2018-10-10 DIAGNOSIS — R69 Illness, unspecified: Secondary | ICD-10-CM | POA: Diagnosis not present

## 2018-10-10 DIAGNOSIS — R972 Elevated prostate specific antigen [PSA]: Secondary | ICD-10-CM | POA: Diagnosis not present

## 2018-10-16 DIAGNOSIS — K769 Liver disease, unspecified: Secondary | ICD-10-CM | POA: Diagnosis not present

## 2018-10-16 DIAGNOSIS — I10 Essential (primary) hypertension: Secondary | ICD-10-CM | POA: Diagnosis not present

## 2018-10-17 DIAGNOSIS — K429 Umbilical hernia without obstruction or gangrene: Secondary | ICD-10-CM | POA: Diagnosis not present

## 2018-10-17 DIAGNOSIS — D1803 Hemangioma of intra-abdominal structures: Secondary | ICD-10-CM | POA: Diagnosis not present

## 2018-10-17 DIAGNOSIS — K769 Liver disease, unspecified: Secondary | ICD-10-CM | POA: Diagnosis not present

## 2018-11-13 DIAGNOSIS — Z7409 Other reduced mobility: Secondary | ICD-10-CM | POA: Diagnosis not present

## 2018-11-13 DIAGNOSIS — R278 Other lack of coordination: Secondary | ICD-10-CM | POA: Diagnosis not present

## 2018-11-13 DIAGNOSIS — R2689 Other abnormalities of gait and mobility: Secondary | ICD-10-CM | POA: Diagnosis not present

## 2018-11-13 DIAGNOSIS — G20 Parkinson's disease: Secondary | ICD-10-CM | POA: Diagnosis not present

## 2018-11-14 DIAGNOSIS — I10 Essential (primary) hypertension: Secondary | ICD-10-CM | POA: Diagnosis not present

## 2018-11-14 DIAGNOSIS — R399 Unspecified symptoms and signs involving the genitourinary system: Secondary | ICD-10-CM | POA: Diagnosis not present

## 2018-11-14 DIAGNOSIS — R972 Elevated prostate specific antigen [PSA]: Secondary | ICD-10-CM | POA: Diagnosis not present

## 2018-11-22 DIAGNOSIS — R278 Other lack of coordination: Secondary | ICD-10-CM | POA: Diagnosis not present

## 2018-11-22 DIAGNOSIS — E785 Hyperlipidemia, unspecified: Secondary | ICD-10-CM | POA: Diagnosis not present

## 2018-11-22 DIAGNOSIS — Z7409 Other reduced mobility: Secondary | ICD-10-CM | POA: Diagnosis not present

## 2018-11-22 DIAGNOSIS — G20 Parkinson's disease: Secondary | ICD-10-CM | POA: Diagnosis not present

## 2018-11-22 DIAGNOSIS — R2689 Other abnormalities of gait and mobility: Secondary | ICD-10-CM | POA: Diagnosis not present

## 2018-11-22 DIAGNOSIS — G3184 Mild cognitive impairment, so stated: Secondary | ICD-10-CM | POA: Diagnosis not present

## 2018-11-22 DIAGNOSIS — R972 Elevated prostate specific antigen [PSA]: Secondary | ICD-10-CM | POA: Diagnosis not present

## 2018-11-22 DIAGNOSIS — M545 Low back pain: Secondary | ICD-10-CM | POA: Diagnosis not present

## 2018-11-22 DIAGNOSIS — G250 Essential tremor: Secondary | ICD-10-CM | POA: Diagnosis not present

## 2018-11-22 DIAGNOSIS — R69 Illness, unspecified: Secondary | ICD-10-CM | POA: Diagnosis not present

## 2018-11-22 DIAGNOSIS — Z736 Limitation of activities due to disability: Secondary | ICD-10-CM | POA: Diagnosis not present

## 2018-11-22 DIAGNOSIS — M1990 Unspecified osteoarthritis, unspecified site: Secondary | ICD-10-CM | POA: Diagnosis not present

## 2018-12-04 DIAGNOSIS — R296 Repeated falls: Secondary | ICD-10-CM | POA: Diagnosis not present

## 2018-12-04 DIAGNOSIS — G20 Parkinson's disease: Secondary | ICD-10-CM | POA: Diagnosis not present

## 2018-12-04 DIAGNOSIS — R269 Unspecified abnormalities of gait and mobility: Secondary | ICD-10-CM | POA: Diagnosis not present

## 2018-12-04 DIAGNOSIS — R2689 Other abnormalities of gait and mobility: Secondary | ICD-10-CM | POA: Diagnosis not present

## 2018-12-12 DIAGNOSIS — G603 Idiopathic progressive neuropathy: Secondary | ICD-10-CM | POA: Diagnosis not present

## 2018-12-12 DIAGNOSIS — M542 Cervicalgia: Secondary | ICD-10-CM | POA: Diagnosis not present

## 2018-12-12 DIAGNOSIS — G5603 Carpal tunnel syndrome, bilateral upper limbs: Secondary | ICD-10-CM | POA: Diagnosis not present

## 2018-12-12 DIAGNOSIS — Z79899 Other long term (current) drug therapy: Secondary | ICD-10-CM | POA: Diagnosis not present

## 2018-12-12 DIAGNOSIS — M5412 Radiculopathy, cervical region: Secondary | ICD-10-CM | POA: Diagnosis not present

## 2018-12-12 DIAGNOSIS — G20 Parkinson's disease: Secondary | ICD-10-CM | POA: Diagnosis not present

## 2018-12-12 DIAGNOSIS — G5623 Lesion of ulnar nerve, bilateral upper limbs: Secondary | ICD-10-CM | POA: Diagnosis not present

## 2018-12-12 DIAGNOSIS — R270 Ataxia, unspecified: Secondary | ICD-10-CM | POA: Diagnosis not present

## 2018-12-12 DIAGNOSIS — M5417 Radiculopathy, lumbosacral region: Secondary | ICD-10-CM | POA: Diagnosis not present

## 2018-12-25 DIAGNOSIS — I10 Essential (primary) hypertension: Secondary | ICD-10-CM | POA: Diagnosis not present

## 2018-12-25 DIAGNOSIS — R7301 Impaired fasting glucose: Secondary | ICD-10-CM | POA: Diagnosis not present

## 2018-12-25 DIAGNOSIS — E785 Hyperlipidemia, unspecified: Secondary | ICD-10-CM | POA: Diagnosis not present

## 2018-12-28 DIAGNOSIS — G20 Parkinson's disease: Secondary | ICD-10-CM | POA: Diagnosis not present

## 2018-12-28 DIAGNOSIS — E785 Hyperlipidemia, unspecified: Secondary | ICD-10-CM | POA: Diagnosis not present

## 2018-12-28 DIAGNOSIS — R972 Elevated prostate specific antigen [PSA]: Secondary | ICD-10-CM | POA: Diagnosis not present

## 2018-12-28 DIAGNOSIS — I10 Essential (primary) hypertension: Secondary | ICD-10-CM | POA: Diagnosis not present

## 2018-12-28 DIAGNOSIS — Z0000 Encounter for general adult medical examination without abnormal findings: Secondary | ICD-10-CM | POA: Diagnosis not present

## 2018-12-28 DIAGNOSIS — R7301 Impaired fasting glucose: Secondary | ICD-10-CM | POA: Diagnosis not present

## 2018-12-28 DIAGNOSIS — Z23 Encounter for immunization: Secondary | ICD-10-CM | POA: Diagnosis not present

## 2019-01-23 DIAGNOSIS — M542 Cervicalgia: Secondary | ICD-10-CM | POA: Diagnosis not present

## 2019-01-23 DIAGNOSIS — M5417 Radiculopathy, lumbosacral region: Secondary | ICD-10-CM | POA: Diagnosis not present

## 2019-01-23 DIAGNOSIS — R202 Paresthesia of skin: Secondary | ICD-10-CM | POA: Diagnosis not present

## 2019-01-23 DIAGNOSIS — M5412 Radiculopathy, cervical region: Secondary | ICD-10-CM | POA: Diagnosis not present

## 2019-03-17 ENCOUNTER — Other Ambulatory Visit: Payer: Self-pay

## 2019-03-17 ENCOUNTER — Emergency Department
Admission: EM | Admit: 2019-03-17 | Discharge: 2019-03-17 | Disposition: A | Discharge status: Home / Self Care | Payer: Medicare HMO | Source: Home / Self Care | Attending: Emergency Medicine | Admitting: Emergency Medicine

## 2019-03-17 DIAGNOSIS — J3489 Other specified disorders of nose and nasal sinuses: Secondary | ICD-10-CM

## 2019-03-17 DIAGNOSIS — J0100 Acute maxillary sinusitis, unspecified: Secondary | ICD-10-CM | POA: Diagnosis not present

## 2019-03-17 DIAGNOSIS — Z20822 Contact with and (suspected) exposure to covid-19: Secondary | ICD-10-CM | POA: Diagnosis not present

## 2019-03-17 MED ORDER — DOXYCYCLINE HYCLATE 100 MG PO CAPS: 100.0000 mg | ORAL_CAPSULE | Freq: Two times a day (BID) | ORAL | 0 refills | Status: DC

## 2019-03-17 MED ORDER — FLUTICASONE PROPIONATE 50 MCG/ACT NA SUSP: 2.0000 | Freq: Every day | NASAL | 0 refills | Status: DC

## 2019-03-17 NOTE — ED Provider Notes (Signed)
HPI  SUBJECTIVE:  Daniel Ortega is a 59 y.o. male who presents with a "sinus infection" for the past 4 days.  He reports nasal congestion, clear rhinorrhea that has now become yellowish-greenish, some frontal sinus pain and pressure, post nasal drip, scratchy sore throat and occasional cough secondary to the postnasal drip.  No facial swelling, upper dental pain.  No fevers, body aches, other headaches, shortness of breath, nausea, vomiting, diarrhea, abdominal pain.  Has not had a sense of taste or smell for 5 years secondary to Parkinson's.  No known Covid exposure.  He took an antipyretic within the past 4 to 6 hours.  No antibiotics in the past month.  He has been taking Mucinex with improvement in his symptoms, symptoms are worse with lying down.  No allergy symptoms.  He has a past medical history of Parkinson's, frequent sinusitis, hypertension, opiate dependence, paroxysmal atrial tachycardia/paroxysmal SVT, UTI.  No history of diabetes, coronary disease, chronic kidney disease, HIV, immunocompromise, cancer.  PMD: Dr. Ronie Spies     Past Medical History:  Diagnosis Date  . Anxiety   . Anxiety and depression   . Hyperlipemia   . Hyperlipemia   . Hypertension   . Parkinson's disease ([place])   . Seasonal allergies     Past Surgical History:  Procedure Laterality Date  . ADENOIDECTOMY    . KNEE SURGERY    . NECK SURGERY    . TONSILLECTOMY      Family History  Problem Relation Age of Onset  . Hypertension Mother   . Anxiety disorder Mother   . Hypertension Father   . Anxiety disorder Father   . Hypertension Brother   . Anxiety disorder Brother     Social History   Tobacco Use  . Smoking status: Never Smoker  . Smokeless tobacco: Current User    Types: Chew  Substance Use Topics  . Alcohol use: No  . Drug use: No    No current facility-administered medications for this encounter.  Current Outpatient Medications:  .  ALPRAZolam (XANAX) 0.5 MG tablet, TAKE ONE  TABLET BY MOUTH 3 TIMES A DAY, Disp: , Rfl:  .  amoxicillin-clavulanate (AUGMENTIN) 875-125 MG tablet, Take 1 tablet by mouth 2 (two) times daily. Take with food (Rx void after 01/22/18), Disp: 20 tablet, Rfl: 0 .  Apomorphine HCl (APOKYN) 30 MG/3ML SOCT, Inject into the skin., Disp: , Rfl:  .  aspirin 81 MG chewable tablet, Chew 81 mg by mouth., Disp: , Rfl:  .  benzonatate (TESSALON) 200 MG capsule, Take one cap by mouth at bedtime as needed for cough.  May repeat in 4 to 6 hours, Disp: 15 capsule, Rfl: 0 .  Carbidopa-Levodopa ER (SINEMET CR) 25-100 MG tablet controlled release, Take 1 tablet by mouth 2 (two) times daily., Disp: , Rfl:  .  diclofenac (VOLTAREN) 75 MG EC tablet, Take 75 mg by mouth., Disp: , Rfl:  .  DULoxetine (CYMBALTA) 60 MG capsule, Take 60 mg by mouth., Disp: , Rfl:  .  escitalopram (LEXAPRO) 10 MG tablet, Take 10 mg by mouth daily., Disp: , Rfl:  .  gabapentin (NEURONTIN) 100 MG capsule, Take 100 mg by mouth 3 (three) times daily., Disp: , Rfl:  .  olmesartan (BENICAR) 20 MG tablet, Take 20 mg by mouth daily., Disp: , Rfl:  .  Pimavanserin Tartrate (NUPLAZID) 10 MG TABS, Take by mouth., Disp: , Rfl:  .  predniSONE (DELTASONE) 20 MG tablet, Take one tab by mouth twice daily  for 5 days, then one daily for 3 days. Take with food., Disp: 13 tablet, Rfl: 0 .  rasagiline (AZILECT) 0.5 MG TABS tablet, Take 0.5 mg by mouth daily., Disp: , Rfl:  .  rOPINIRole (REQUIP) 0.5 MG tablet, 1/2 TAB BY MOUTH 3 TIMES A DAY X 1 WK AND THEN 1 TAB BY MOUTH 3 TIMES A DAY FOR 30 DAYS, Disp: , Rfl:  .  simvastatin (ZOCOR) 40 MG tablet, Take 40 mg by mouth., Disp: , Rfl:  .  tamsulosin (FLOMAX) 0.4 MG CAPS capsule, Take 0.4 mg by mouth., Disp: , Rfl:  .  TIZANIDINE HCL PO, Take by mouth., Disp: , Rfl:  .  UNKNOWN TO PATIENT, Anti-hypertensive, Disp: , Rfl:  .  valsartan-hydrochlorothiazide (DIOVAN-HCT) 320-25 MG per tablet, Take 1 tablet by mouth., Disp: , Rfl:   Allergies  Allergen Reactions  .  Bee Venom Anaphylaxis  . Atorvastatin Nausea Only  . Codeine Hives  . Metoprolol Tartrate Other (See Comments)    Headache Fatigue     ROS  As noted in HPI.   Physical Exam  BP (!) 152/89 (BP Location: Right Arm)   Pulse 65   Temp 98.3 F (36.8 C) (Oral)   Resp 20   Ht 5\' 9"  (1.753 m)   Wt 88.5 kg   SpO2 97%   BMI 28.80 kg/m   Constitutional: Well developed, well nourished, no acute distress Eyes:  EOMI, conjunctiva normal bilaterally HENT: Normocephalic, atraumatic,mucus membranes moist.  Mild nasal congestion.  Normal turbinates.  No frontal sinus tenderness.  Positive maxillary sinus tenderness.  No obvious postnasal drip Respiratory: Normal inspiratory effort, lungs clear bilaterally cardiovascular: Normal rate regular rhythm no murmurs rubs or gallops GI: nondistended skin: No rash, skin intact Musculoskeletal: no deformities Neurologic: Alert & oriented x 3, no focal neuro deficits Psychiatric: Speech and behavior appropriate   ED Course   Medications - No data to display  Orders Placed This Encounter  Procedures  . Novel Coronavirus, NAA ([facility])    Standing Status:   Standing    Number of Occurrences:   1    No results found for this or any previous visit (from the past 24 hour(s)). No results found.  ED Clinical Impression  1. Nasal drainage   2. Acute non-recurrent maxillary sinusitis   3. Encounter for laboratory testing for COVID-19 virus      ED Assessment/Plan  Presentation consistent with a sinus infection, most likely viral.  We will have him continue Mucinex, start saline nasal irrigation with a Milta Deiters med rinse and distilled water as often as you want, Flonase, wait-and-see prescription of doxycycline.  Discussed indications for starting the doxycycline.  Covid PCR sent.  Follow-up with PMD as needed.  Discussed labs MDM, treatment plan, and plan for follow-up with patient.. patient agrees with plan.   No orders of the defined types  were placed in this encounter.   *This clinic note was created using Dragon dictation software. Therefore, there may be occasional mistakes despite careful proofreading.   ?   Melynda Ripple, MD 03/17/19 [phone]

## 2019-03-17 NOTE — Discharge Instructions (Addendum)
Continue Mucinex, start saline nasal irrigation with a Milta Deiters med rinse and distilled water as often as you want, Flonase, wait-and-see prescription of doxycycline.  Covid PCR will be back in 18 to 48 hours.

## 2019-03-17 NOTE — ED Triage Notes (Signed)
Pt feels like he has a sinus infection.  Started Monday, nasal congestion, scratchy throat, pressure in the head and face.  Yellowish/greenish drainage from nose.

## 2019-03-18 ENCOUNTER — Telehealth: Payer: Self-pay

## 2019-03-18 MED ORDER — DOXYCYCLINE HYCLATE 100 MG PO CAPS: 100.0000 mg | ORAL_CAPSULE | Freq: Two times a day (BID) | ORAL | 0 refills | Status: AC

## 2019-03-18 MED ORDER — FLUTICASONE PROPIONATE 50 MCG/ACT NA SUSP: 2.0000 | Freq: Every day | NASAL | 0 refills | Status: AC

## 2019-03-19 LAB — NOVEL CORONAVIRUS, NAA: SARS-CoV-2, NAA: NOT DETECTED

## 2019-03-23 NOTE — Telephone Encounter (Signed)
Pt called, given negative results.

## 2020-11-07 IMAGING — US US RENAL
1 series · 14 of 25 positions shown · non-contrast
Comparison: None.

CLINICAL DATA: 58-year-old male with a history of flank pain

EXAM:
RENAL / URINARY TRACT ULTRASOUND COMPLETE

[Series 1: us renal · 0.24mm/px · 14 of 87 slices shown]
[im 1/87]
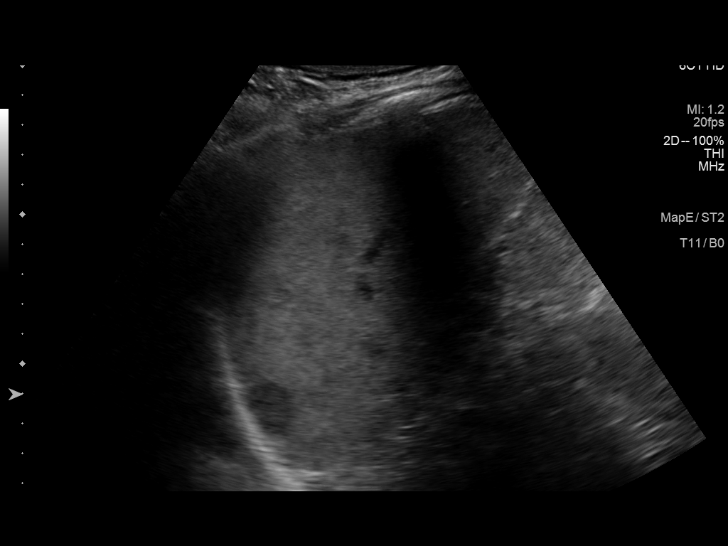
[im 8/87]
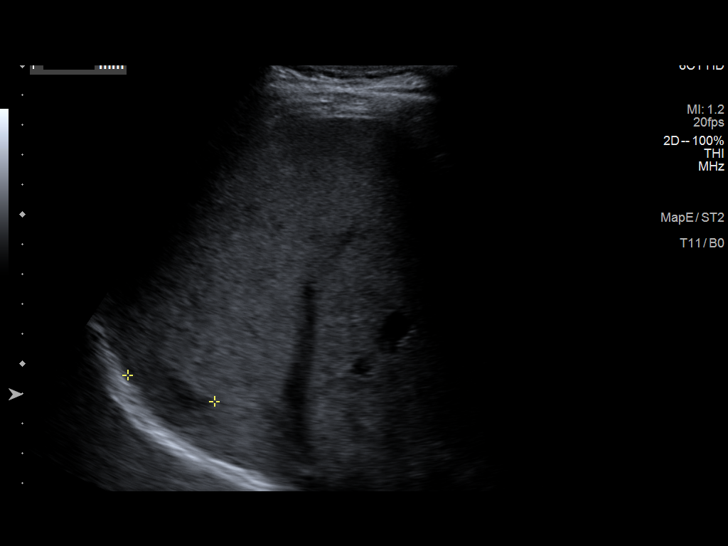
[im 15/87]
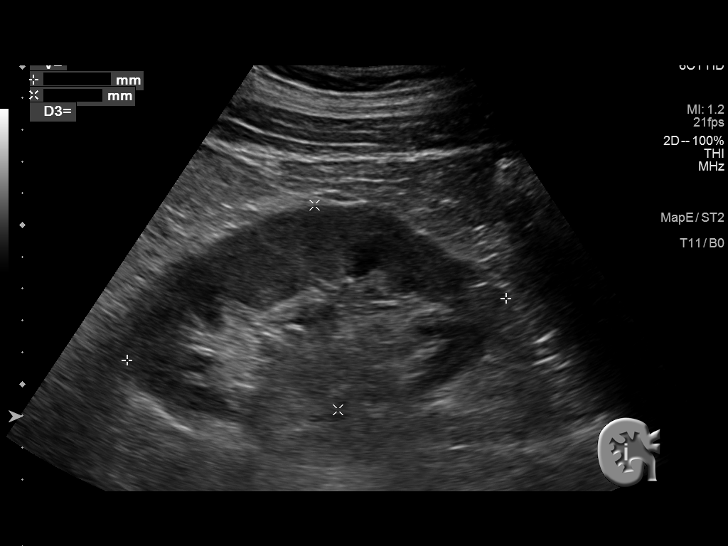
[im 22/87]
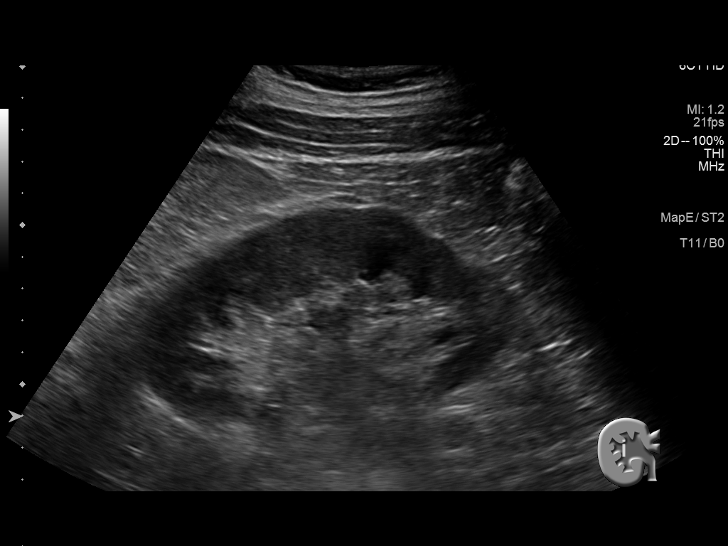
[im 29/87]
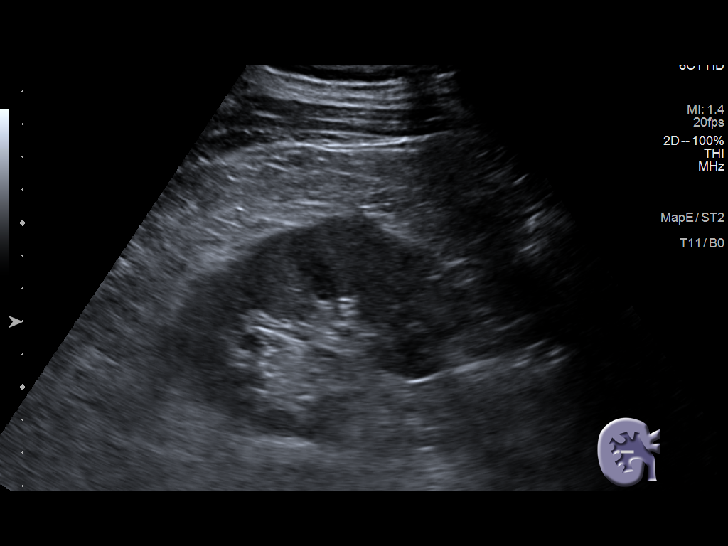
[im 33/87]
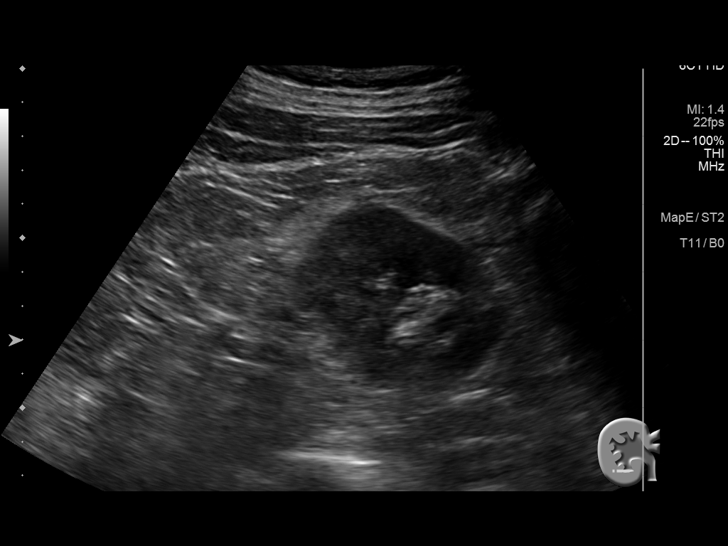
[im 40/87]
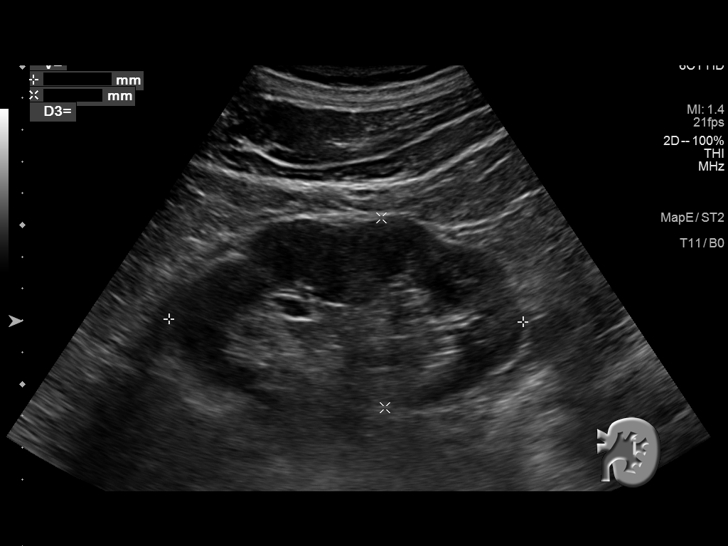
[im 47/87]
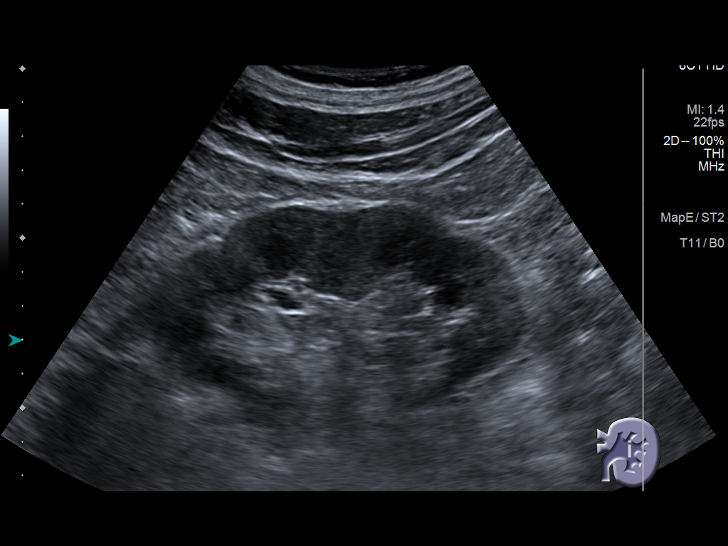
[im 54/87]
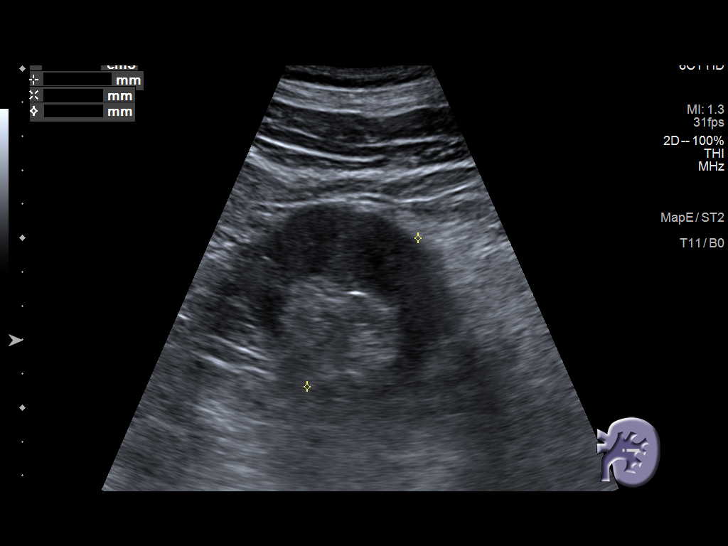
[im 58/87]
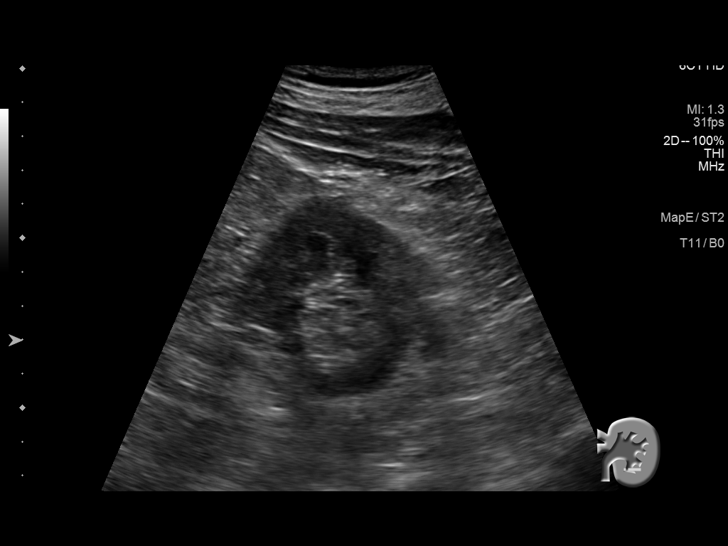
[im 65/87]
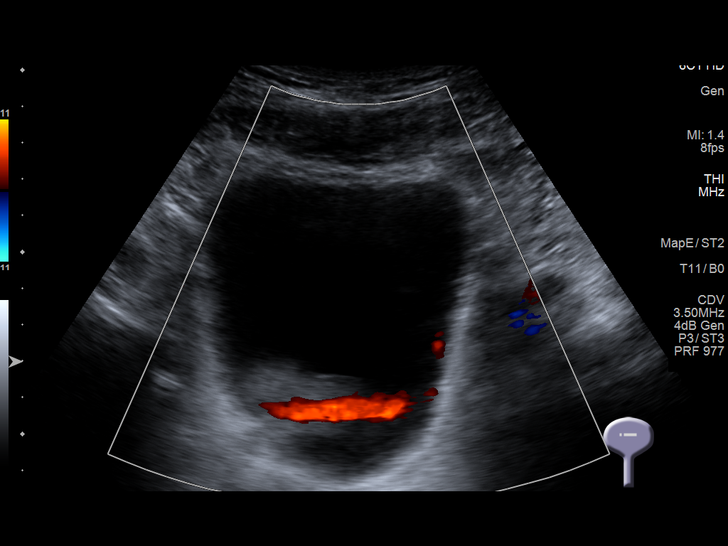
[im 72/87]
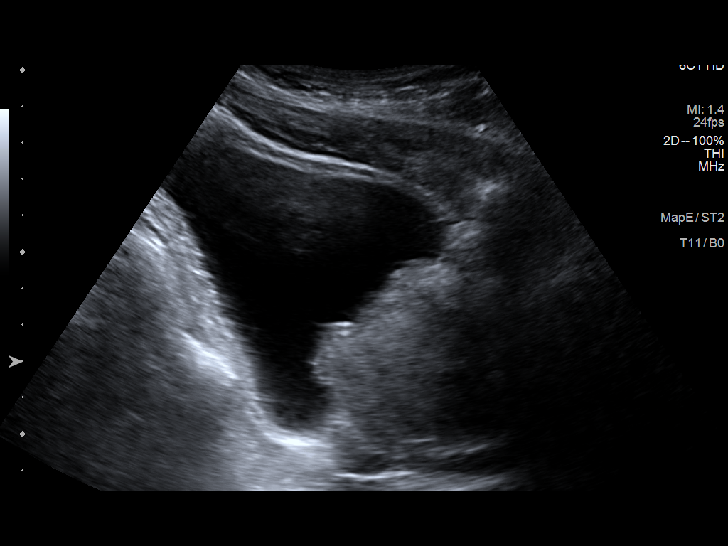
[im 79/87]
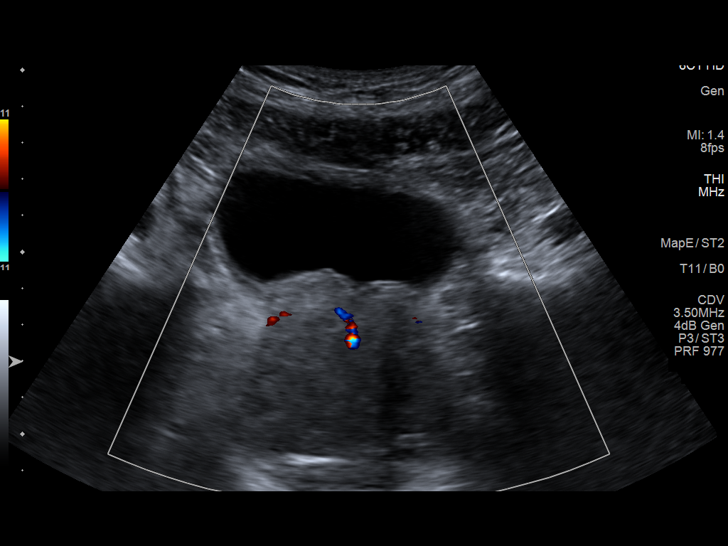
[im 87/87]
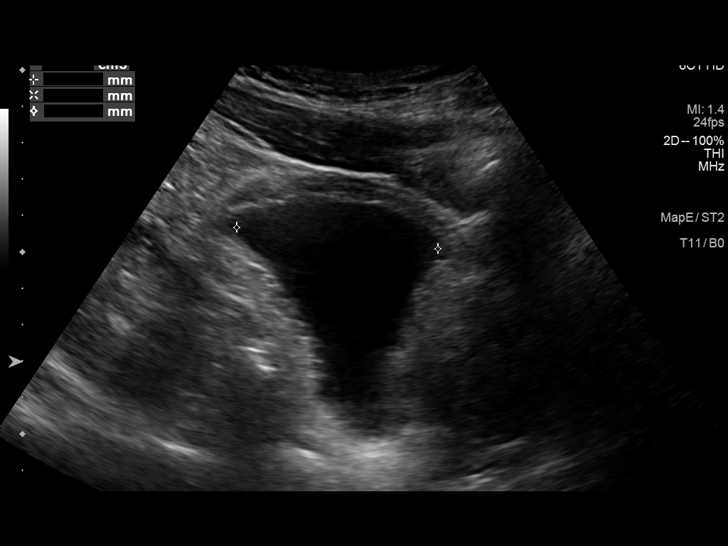

[14 of 25 positions shown; findings below may reference images not displayed]

FINDINGS: Right Kidney:

Length: 12.1 cm x 6.5 cm x 6.5 cm, 265 cc. Echogenicity within
normal limits. No mass or hydronephrosis visualized.

Left Kidney:

Length: 11.1 cm x 6.0 cm x 5.5 cm, 190 cc. Echogenicity within
normal limits. No mass or hydronephrosis visualized.

Bladder:

Bilateral ureteral jets. Prostate is enlarged measuring 6.0 cm x
cm x 5.4 cm, volume of 126 cc.

Incidental imaging of a focal hypoechoic lesion in the dome of the
liver on the right measuring just less than 2 cm.
IMPRESSION: Sonographic survey of the kidneys unremarkable with no
hydronephrosis.

Incidental imaging of a hypoechoic lesion of the right liver.
Further evaluation with contrast-enhanced CT is recommended.

Prostatomegaly with an estimated volume of 126 cc
